# Patient Record
Sex: Female | Born: 1980
Health system: Southern US, Community
[De-identification: ages and names within clinical notes are randomized; demographics above are authoritative.]

## PROBLEM LIST (undated history)

## (undated) DIAGNOSIS — E669 Obesity, unspecified: Secondary | ICD-10-CM

## (undated) DIAGNOSIS — R5383 Other fatigue: Secondary | ICD-10-CM

## (undated) DIAGNOSIS — M2550 Pain in unspecified joint: Secondary | ICD-10-CM

## (undated) DIAGNOSIS — R002 Palpitations: Secondary | ICD-10-CM

## (undated) DIAGNOSIS — E0500 Thyrotoxicosis with diffuse goiter without thyrotoxic crisis or storm: Secondary | ICD-10-CM

## (undated) DIAGNOSIS — R7303 Prediabetes: Secondary | ICD-10-CM

## (undated) DIAGNOSIS — E119 Type 2 diabetes mellitus without complications: Secondary | ICD-10-CM

## (undated) DIAGNOSIS — F32A Depression, unspecified: Secondary | ICD-10-CM

## (undated) DIAGNOSIS — J188 Other pneumonia, unspecified organism: Secondary | ICD-10-CM

## (undated) DIAGNOSIS — F419 Anxiety disorder, unspecified: Secondary | ICD-10-CM

## (undated) DIAGNOSIS — Z8489 Family history of other specified conditions: Secondary | ICD-10-CM

## (undated) DIAGNOSIS — B977 Papillomavirus as the cause of diseases classified elsewhere: Secondary | ICD-10-CM

## (undated) DIAGNOSIS — U071 COVID-19: Secondary | ICD-10-CM

## (undated) DIAGNOSIS — R609 Edema, unspecified: Secondary | ICD-10-CM

## (undated) DIAGNOSIS — J189 Pneumonia, unspecified organism: Secondary | ICD-10-CM

## (undated) HISTORY — DX: Anxiety disorder, unspecified: F41.9

## (undated) HISTORY — DX: Prediabetes: R73.03

## (undated) HISTORY — PX: COLPOSCOPY: SHX161

## (undated) HISTORY — PX: TONSILLECTOMY: SUR1361

## (undated) HISTORY — DX: Obesity, unspecified: E66.9

## (undated) HISTORY — DX: Pneumonia, unspecified organism: J18.9

## (undated) HISTORY — DX: Depression, unspecified: F32.A

## (undated) HISTORY — DX: Other pneumonia, unspecified organism: J18.8

## (undated) HISTORY — DX: Edema, unspecified: R60.9

## (undated) HISTORY — DX: Other fatigue: R53.83

## (undated) HISTORY — DX: Palpitations: R00.2

## (undated) HISTORY — PX: TUBAL LIGATION: SHX77

## (undated) HISTORY — DX: Pain in unspecified joint: M25.50

## (undated) HISTORY — DX: Type 2 diabetes mellitus without complications: E11.9

---

## 2000-06-15 ENCOUNTER — Encounter (INDEPENDENT_AMBULATORY_CARE_PROVIDER_SITE_OTHER): Payer: Self-pay | Admitting: Specialist

## 2000-06-15 ENCOUNTER — Other Ambulatory Visit: Admission: RE | Admit: 2000-06-15 | Discharge: 2000-06-15 | Payer: Self-pay | Admitting: Obstetrics and Gynecology

## 2000-08-06 ENCOUNTER — Encounter (INDEPENDENT_AMBULATORY_CARE_PROVIDER_SITE_OTHER): Payer: Self-pay

## 2000-08-06 ENCOUNTER — Ambulatory Visit (HOSPITAL_COMMUNITY): Admission: RE | Admit: 2000-08-06 | Discharge: 2000-08-06 | Payer: Self-pay | Admitting: Obstetrics and Gynecology

## 2000-11-02 ENCOUNTER — Other Ambulatory Visit: Admission: RE | Admit: 2000-11-02 | Discharge: 2000-11-02 | Payer: Self-pay | Admitting: Obstetrics and Gynecology

## 2000-11-02 ENCOUNTER — Encounter (INDEPENDENT_AMBULATORY_CARE_PROVIDER_SITE_OTHER): Payer: Self-pay | Admitting: Specialist

## 2001-10-10 ENCOUNTER — Other Ambulatory Visit: Admission: RE | Admit: 2001-10-10 | Discharge: 2001-10-10 | Payer: Self-pay | Admitting: Obstetrics and Gynecology

## 2003-11-02 ENCOUNTER — Other Ambulatory Visit: Admission: RE | Admit: 2003-11-02 | Discharge: 2003-11-02 | Payer: Self-pay | Admitting: Obstetrics and Gynecology

## 2004-04-28 ENCOUNTER — Other Ambulatory Visit: Admission: RE | Admit: 2004-04-28 | Discharge: 2004-04-28 | Payer: Self-pay | Admitting: Obstetrics and Gynecology

## 2005-01-01 ENCOUNTER — Other Ambulatory Visit: Admission: RE | Admit: 2005-01-01 | Discharge: 2005-01-01 | Payer: Self-pay | Admitting: Obstetrics and Gynecology

## 2005-07-16 ENCOUNTER — Other Ambulatory Visit: Admission: RE | Admit: 2005-07-16 | Discharge: 2005-07-16 | Payer: Self-pay | Admitting: Obstetrics and Gynecology

## 2006-09-10 ENCOUNTER — Other Ambulatory Visit: Admission: RE | Admit: 2006-09-10 | Discharge: 2006-09-10 | Payer: Self-pay | Admitting: Obstetrics and Gynecology

## 2008-03-16 ENCOUNTER — Other Ambulatory Visit: Admission: RE | Admit: 2008-03-16 | Discharge: 2008-03-16 | Payer: Self-pay | Admitting: Obstetrics and Gynecology

## 2009-01-05 ENCOUNTER — Inpatient Hospital Stay (HOSPITAL_COMMUNITY): Admission: AD | Admit: 2009-01-05 | Discharge: 2009-01-08 | Payer: Self-pay | Admitting: Obstetrics and Gynecology

## 2009-04-18 ENCOUNTER — Other Ambulatory Visit: Admission: RE | Admit: 2009-04-18 | Discharge: 2009-04-18 | Payer: Self-pay | Admitting: Obstetrics and Gynecology

## 2010-04-22 ENCOUNTER — Other Ambulatory Visit: Admission: RE | Admit: 2010-04-22 | Discharge: 2010-04-22 | Payer: Self-pay | Admitting: Internal Medicine

## 2010-11-03 LAB — CBC
HCT: 30.4 % — ABNORMAL LOW (ref 36.0–46.0)
HCT: 35.5 % — ABNORMAL LOW (ref 36.0–46.0)
Hemoglobin: 10.8 g/dL — ABNORMAL LOW (ref 12.0–15.0)
Hemoglobin: 12.6 g/dL (ref 12.0–15.0)
MCHC: 35.4 g/dL (ref 30.0–36.0)
MCHC: 35.6 g/dL (ref 30.0–36.0)
MCV: 88.2 fL (ref 78.0–100.0)
MCV: 89 fL (ref 78.0–100.0)
Platelets: 140 10*3/uL — ABNORMAL LOW (ref 150–400)
Platelets: 150 10*3/uL (ref 150–400)
RBC: 3.41 MIL/uL — ABNORMAL LOW (ref 3.87–5.11)
RBC: 4.02 MIL/uL (ref 3.87–5.11)
RDW: 15.9 % — ABNORMAL HIGH (ref 11.5–15.5)
RDW: 16.1 % — ABNORMAL HIGH (ref 11.5–15.5)
WBC: 10.5 10*3/uL (ref 4.0–10.5)
WBC: 21.2 10*3/uL — ABNORMAL HIGH (ref 4.0–10.5)

## 2010-11-03 LAB — COMPREHENSIVE METABOLIC PANEL
ALT: 14 U/L (ref 0–35)
AST: 21 U/L (ref 0–37)
Albumin: 2.8 g/dL — ABNORMAL LOW (ref 3.5–5.2)
Alkaline Phosphatase: 140 U/L — ABNORMAL HIGH (ref 39–117)
BUN: 9 mg/dL (ref 6–23)
CO2: 22 mEq/L (ref 19–32)
Calcium: 9.2 mg/dL (ref 8.4–10.5)
Chloride: 107 mEq/L (ref 96–112)
Creatinine, Ser: 0.73 mg/dL (ref 0.4–1.2)
GFR calc Af Amer: >60 mL/min (ref >60)
GFR calc non Af Amer: >60 mL/min (ref >60)
Glucose, Bld: 90 mg/dL (ref 70–99)
Potassium: 3.9 mEq/L (ref 3.5–5.1)
Sodium: 137 mEq/L (ref 135–145)
Total Bilirubin: 0.6 mg/dL (ref 0.3–1.2)
Total Protein: 6.3 g/dL (ref 6.0–8.3)

## 2010-11-03 LAB — URINALYSIS, DIPSTICK ONLY
Bilirubin Urine: NEGATIVE
Glucose, UA: NEGATIVE mg/dL
Hgb urine dipstick: NEGATIVE
Ketones, ur: NEGATIVE mg/dL
Nitrite: NEGATIVE
Protein, ur: NEGATIVE mg/dL
Specific Gravity, Urine: 1.025 (ref 1.005–1.030)
Urobilinogen, UA: 0.2 mg/dL (ref 0.0–1.0)
pH: 6 (ref 5.0–8.0)

## 2010-11-03 LAB — URIC ACID: Uric Acid, Serum: 6.6 mg/dL (ref 2.4–7.0)

## 2010-11-03 LAB — RPR: RPR Ser Ql: NONREACTIVE

## 2010-11-03 LAB — LACTATE DEHYDROGENASE: LDH: 150 U/L (ref 94–250)

## 2010-12-12 NOTE — H&P (Signed)
[facility]  Patient:    Brittney Lester, Brittney Lester                          MRN: [id number] Adm. Date:  08/06/00 Attending:  Beather Arbour. Thomasena Edis, M.D.                         History and Physical  HISTORY OF PRESENT ILLNESS:   The patient is a 30 year old para 0 who was found to have a low grade squamous intraepithelial lesion on her Pap smear. 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  The patient is thus admitted for laser treatment to this area.  Risks of surgery including anesthetic complications, hemorrhage, infection, damage to adjacent structures including bladder, bowel, blood vessels, or ureters were discussed with the patient.  She was made aware of the risk of third degree burn which could result in need for a skin graft, however, this usually does not occur. In addition she was made aware of retinal damage.  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  The patient expressed understanding of and acceptance of these risks.  MEDICATIONS:                  Ortho-Tricyclen.  ALLERGIES:                    No known drug allergies.  PAST MEDICAL HISTORY:         None except for suppressed TSH in the process of being worked up.  FAMILY HISTORY:               Positive for diabetes, coronary artery disease, MI, and hypertension.  REVIEW OF SYSTEMS:            Negative.  PHYSICAL EXAMINATION:  HEENT:                        Normal.  NECK:                         Supple without thyromegaly.  LUNGS:                        Clear to auscultation.  HEART:                        Regular rate and rhythm.  ABDOMEN:                      Soft and nontender with no hepatosplenomegaly.  PELVIC:                       BUS normal.  Vagina normal.  The VAIN-1 and 2 can only be seen with the colposcope.   The uterus is normal, mobile.  No adnexal masses palpated.  RECTAL:                       Confirmatory with no mass.  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  The patient does understand that additional biopsies may need to be taken and that she may also have laser treatment to this area as well. DD:  08/06/00 TD:  08/06/00 Job: [id number]

## 2010-12-12 NOTE — Op Note (Signed)
[facility]  Patient:    GENEA, RHEAUME                       MRN: [id number] Proc. Date: 08/06/00 Adm. 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  POSTOPERATIVE DIAGNOSES:      Acetowhite epithelium at left hymenal ring, multipigmented right mons pubis nevus.  PROCEDURE:                    Laser to VAIN 2, biopsy of left hymenal ring, and laser to abnormal areas of left hymenal ring, excision of right mons pubis multipigmented nevus.  SURGEON:                      Beather Arbour. Thomasena Edis, M.D., Ph.D.  Bronwen Betters BLOOD LOSS:         Minimal.  ANESTHESIA:                   Monitored anesthesia care plus 15 cc 1% lidocaine local anesthesia.  COMPLICATIONS:                None.  FLUIDS:                       1400 cc of crystalloid.  DRAINS:                       None.  FINDINGS:                     Acetowhite epithelium consistent with VAIN 2 at right hymenal ring.  Left hymenal ring with new onset acetowhite epithelium and some mosaicism which was biopsied in two places and right mons pubis multipigmented vulvar lesion.  DESCRIPTION OF OPERATION:     The patient was brought to the operating room and identified on the operating room table.  After she was adequately sedated, she was placed in the dorsal lithotomy position and draped with wet towels. An ice pack was placed across the vulvar area.  Inspection did reveal a multipigmented right vulvar nevus, and the decision was made to remove this as well.  A very careful and thorough culdoscopic examination was performed all in the vagina.  There were noted to be no additional abnormal areas deep in the vagina; however, there was noted to be an area of mosaicism with acetowhite epithelium at the left hymenal ring, and this was biopsied in two places.  Using  the laser on the most focused beam at 10 watts, after infiltrating the abnormal areas with 1% lidocaine, the areas were then lasered without difficulty down to pink adventicial type tissue.  Excellent treatment was noted.  There was noted to be minimal bleeding.  This was done very carefully to ensure the area was entirely adequately treated.  Then Silvadene cream was applied to the areas that had been lasered.  Attention was turned to the right multipigmented vulvar nevus.  This area was infiltrated with 1% lidocaine, and an elliptical incision was performed with the entire nevus excised and sent to pathology for examination.  The skin edges were then reapproximated using four interrupted sutures of 4-0 Ethilon.  At that point, the procedure was then terminated.  The patient tolerated the procedure well without apparent complications and was transferred to the recovery room in stable condition after sponge, needle and instrument counts were correct.  The patient was given postoperative instructions, urged to place 1/2 to 1 cup of Instant Ocean in the bathtub and soak 2-3 times daily and dry off with a hair dryer and use Silvadene cream afterward.  She was also to apply triple antibiotic ointment to the vulvar nevus site and will return in ten days to have the sutures removed.  She was given a prescription for Darvocet-N 100, 1-2 p.o. q.4-6h. p.r.n. pain and a prescription for Silvadene cream, 1 tube with 1 refill.  She is urged to call with any problems. DD:  08/06/00 TD:  08/07/00 Job: [id number]

## 2010-12-31 ENCOUNTER — Inpatient Hospital Stay (HOSPITAL_COMMUNITY)
Admission: RE | Admit: 2010-12-31 | Discharge: 2011-01-02 | DRG: 373 | Disposition: A | Discharge status: Home / Self Care | Payer: BC Managed Care – PPO | Source: Ambulatory Visit | Attending: Obstetrics and Gynecology | Admitting: Obstetrics and Gynecology

## 2010-12-31 DIAGNOSIS — D649 Anemia, unspecified: Secondary | ICD-10-CM | POA: Diagnosis not present

## 2010-12-31 DIAGNOSIS — O9903 Anemia complicating the puerperium: Secondary | ICD-10-CM | POA: Diagnosis not present

## 2010-12-31 LAB — CBC
HCT: 36.2 % (ref 36.0–46.0)
Hemoglobin: 12.5 g/dL (ref 12.0–15.0)
MCH: 30 pg (ref 26.0–34.0)
MCHC: 34.5 g/dL (ref 30.0–36.0)
MCV: 87 fL (ref 78.0–100.0)
Platelets: 158 10*3/uL (ref 150–400)
RBC: 4.16 MIL/uL (ref 3.87–5.11)
RDW: 15.9 % — ABNORMAL HIGH (ref 11.5–15.5)
WBC: 12.7 10*3/uL — ABNORMAL HIGH (ref 4.0–10.5)

## 2010-12-31 LAB — ABO/RH: ABO/RH(D): O POS

## 2010-12-31 LAB — RPR: RPR Ser Ql: NONREACTIVE

## 2011-01-01 LAB — CBC
HCT: 32.4 % — ABNORMAL LOW (ref 36.0–46.0)
Hemoglobin: 10.9 g/dL — ABNORMAL LOW (ref 12.0–15.0)
MCH: 29.6 pg (ref 26.0–34.0)
MCHC: 33.6 g/dL (ref 30.0–36.0)
MCV: 88 fL (ref 78.0–100.0)
Platelets: 139 10*3/uL — ABNORMAL LOW (ref 150–400)
RBC: 3.68 MIL/uL — ABNORMAL LOW (ref 3.87–5.11)
RDW: 16 % — ABNORMAL HIGH (ref 11.5–15.5)
WBC: 13.2 10*3/uL — ABNORMAL HIGH (ref 4.0–10.5)

## 2012-07-27 NOTE — L&D Delivery Note (Signed)
Delivery Note At 12:51 AM a viable female was delivered via Vaginal, Spontaneous Delivery (Presentation: Left Occiput Anterior)by RN Jae Dire at 1251 I arrived at 12:53 cord was clamped x 2 and cut. Placenta was delivered intact. .  APGAR: 8,9 ; weight .   Placenta status: Intact, Spontaneous.  Cord: 3 vessels with the following complications: .  Cord pH: NA  Anesthesia: Local  Episiotomy: None Lacerations: 2nd degree;Perineal Suture Repair: 3.0 vicryl Est. Blood Loss (mL): 250  Mom to postpartum.  Baby to nursery-stable.  Brittney Lester J. 04/12/2013, 1:34 AM

## 2012-09-05 LAB — OB RESULTS CONSOLE RUBELLA ANTIBODY, IGM: Rubella: IMMUNE

## 2012-09-05 LAB — OB RESULTS CONSOLE ANTIBODY SCREEN: Antibody Screen: NEGATIVE

## 2012-09-05 LAB — OB RESULTS CONSOLE ABO/RH: RH Type: POSITIVE

## 2012-09-05 LAB — OB RESULTS CONSOLE HIV ANTIBODY (ROUTINE TESTING): HIV: NONREACTIVE

## 2012-09-05 LAB — OB RESULTS CONSOLE HEPATITIS B SURFACE ANTIGEN: Hepatitis B Surface Ag: NEGATIVE

## 2012-09-05 LAB — OB RESULTS CONSOLE RPR: RPR: NONREACTIVE

## 2012-10-07 ENCOUNTER — Other Ambulatory Visit (HOSPITAL_COMMUNITY)
Admission: RE | Admit: 2012-10-07 | Discharge: 2012-10-07 | Disposition: A | Discharge status: Home / Self Care | Payer: BC Managed Care – PPO | Source: Ambulatory Visit | Attending: Obstetrics and Gynecology | Admitting: Obstetrics and Gynecology

## 2012-10-07 ENCOUNTER — Other Ambulatory Visit: Payer: Self-pay | Admitting: Obstetrics and Gynecology

## 2012-10-07 DIAGNOSIS — Z113 Encounter for screening for infections with a predominantly sexual mode of transmission: Secondary | ICD-10-CM | POA: Insufficient documentation

## 2012-10-07 DIAGNOSIS — Z01419 Encounter for gynecological examination (general) (routine) without abnormal findings: Secondary | ICD-10-CM | POA: Insufficient documentation

## 2012-10-07 DIAGNOSIS — Z1151 Encounter for screening for human papillomavirus (HPV): Secondary | ICD-10-CM | POA: Insufficient documentation

## 2013-02-08 ENCOUNTER — Encounter: Payer: Self-pay | Admitting: *Deleted

## 2013-02-08 ENCOUNTER — Encounter: Payer: BC Managed Care – PPO | Attending: Obstetrics and Gynecology | Admitting: *Deleted

## 2013-02-08 VITALS — Ht 64.0 in | Wt 298.9 lb

## 2013-02-08 DIAGNOSIS — Z713 Dietary counseling and surveillance: Secondary | ICD-10-CM | POA: Insufficient documentation

## 2013-02-08 DIAGNOSIS — O09293 Supervision of pregnancy with other poor reproductive or obstetric history, third trimester: Secondary | ICD-10-CM

## 2013-02-08 DIAGNOSIS — O99810 Abnormal glucose complicating pregnancy: Secondary | ICD-10-CM | POA: Insufficient documentation

## 2013-02-08 NOTE — Patient Instructions (Addendum)
GOALS:  Check glucose levels per MD as instructed  Follow Gestational Diabetes Diet as instructed  Call for follow-up as needed

## 2013-02-08 NOTE — Progress Notes (Signed)
Patient was seen on 02-08-13 for Gestational Diabetes self-management class at the [facility]. The following learning objectives were met by the patient during this course:             States the definition of Gestational Diabetes          States why dietary management is important in controlling blood glucose           Describes the effects each nutrient has on blood glucose levels          Demonstrates ability to create a balanced meal plan           Demonstrates carbohydrate counting            States when to check blood glucose levels           Demonstrates proper blood glucose monitoring techniques          States the effect of stress and exercise on blood glucose levels           States the importance of limiting caffeine and abstaining from alcohol and smoking   Blood glucose monitor given: [employer] Next Lot # [id number] Exp: 10/2013 Blood glucose reading: 93   Patient instructed to monitor glucose levels: FBS: 60 - <90 1 hour: <140 2 hour: <120   *Patient received handouts:          Nutrition Diabetes and Pregnancy           Carbohydrate Counting List   Patient will be seen for follow-up as needed

## 2013-02-09 ENCOUNTER — Encounter: Payer: Self-pay | Admitting: *Deleted

## 2013-02-09 NOTE — Progress Notes (Signed)
  Patient was seen on 02/08/13 for Gestational Diabetes self-management class at the [facility]. The following learning objectives were met by the patient during this course:   States the definition of Gestational Diabetes  States why dietary management is important in controlling blood glucose  Describes the effects each nutrient has on blood glucose levels  Demonstrates ability to create a balanced meal plan  Demonstrates carbohydrate counting   States when to check blood glucose levels  Demonstrates proper blood glucose monitoring techniques  States the effect of stress and exercise on blood glucose levels  States the importance of limiting caffeine and abstaining from alcohol and smoking  Blood glucose monitor given: [employer] Next Lot # [id number] Exp: 10/2013 Blood glucose reading: 93 mg/dl  Patient instructed to monitor glucose levels: FBS: 60 - <90 1 hour: <140 2 hour: <120  *Patient received handouts:  Nutrition Diabetes and Pregnancy  Carbohydrate Counting List  Patient will be seen for follow-up as needed.

## 2013-03-20 LAB — OB RESULTS CONSOLE GBS: GBS: NEGATIVE

## 2013-04-11 ENCOUNTER — Telehealth (HOSPITAL_COMMUNITY): Payer: Self-pay | Admitting: *Deleted

## 2013-04-11 ENCOUNTER — Inpatient Hospital Stay (HOSPITAL_COMMUNITY)
Admission: AD | Admit: 2013-04-11 | Discharge: 2013-04-14 | DRG: 372 | Disposition: A | Discharge status: Home / Self Care | Payer: BC Managed Care – PPO | Source: Ambulatory Visit | Attending: Obstetrics and Gynecology | Admitting: Obstetrics and Gynecology

## 2013-04-11 ENCOUNTER — Encounter (HOSPITAL_COMMUNITY): Payer: Self-pay | Admitting: *Deleted

## 2013-04-11 DIAGNOSIS — O24419 Gestational diabetes mellitus in pregnancy, unspecified control: Secondary | ICD-10-CM | POA: Diagnosis present

## 2013-04-11 DIAGNOSIS — O2432 Unspecified pre-existing diabetes mellitus in childbirth: Secondary | ICD-10-CM | POA: Diagnosis present

## 2013-04-11 DIAGNOSIS — E119 Type 2 diabetes mellitus without complications: Secondary | ICD-10-CM | POA: Diagnosis present

## 2013-04-11 NOTE — Telephone Encounter (Signed)
Preadmission screen  

## 2013-04-12 ENCOUNTER — Encounter (HOSPITAL_COMMUNITY): Payer: Self-pay | Admitting: *Deleted

## 2013-04-12 LAB — CBC
HCT: 31 % — ABNORMAL LOW (ref 36.0–46.0)
HCT: 34.6 % — ABNORMAL LOW (ref 36.0–46.0)
Hemoglobin: 10.8 g/dL — ABNORMAL LOW (ref 12.0–15.0)
Hemoglobin: 12.2 g/dL (ref 12.0–15.0)
MCH: 29 pg (ref 26.0–34.0)
MCH: 29.1 pg (ref 26.0–34.0)
MCHC: 34.8 g/dL (ref 30.0–36.0)
MCHC: 35.3 g/dL (ref 30.0–36.0)
MCV: 82.6 fL (ref 78.0–100.0)
MCV: 83.1 fL (ref 78.0–100.0)
Platelets: 171 10*3/uL (ref 150–400)
Platelets: 177 10*3/uL (ref 150–400)
RBC: 3.73 MIL/uL — ABNORMAL LOW (ref 3.87–5.11)
RBC: 4.19 MIL/uL (ref 3.87–5.11)
RDW: 16.5 % — ABNORMAL HIGH (ref 11.5–15.5)
RDW: 16.8 % — ABNORMAL HIGH (ref 11.5–15.5)
WBC: 14 10*3/uL — ABNORMAL HIGH (ref 4.0–10.5)
WBC: 18.7 10*3/uL — ABNORMAL HIGH (ref 4.0–10.5)

## 2013-04-12 LAB — TYPE AND SCREEN
ABO/RH(D): O POS
Antibody Screen: NEGATIVE

## 2013-04-12 LAB — RPR: RPR Ser Ql: NONREACTIVE

## 2013-04-12 MED ORDER — METHYLERGONOVINE MALEATE 0.2 MG PO TABS: 0.2000 mg | ORAL_TABLET | ORAL | Status: DC | PRN

## 2013-04-12 MED ORDER — OXYCODONE-ACETAMINOPHEN 5-325 MG PO TABS: 1.0000 | ORAL_TABLET | ORAL | Status: DC | PRN

## 2013-04-12 MED ORDER — FERROUS SULFATE 325 (65 FE) MG PO TABS
325.0000 mg | ORAL_TABLET | Freq: Two times a day (BID) | ORAL | Status: DC
  Administered 2013-04-12 – 2013-04-14 (×5): 325 mg via ORAL
  Filled 2013-04-12 (×6): qty 1

## 2013-04-12 MED ORDER — ONDANSETRON HCL 4 MG/2ML IJ SOLN: 4.0000 mg | Freq: Four times a day (QID) | INTRAMUSCULAR | Status: DC | PRN

## 2013-04-12 MED ORDER — ACETAMINOPHEN 325 MG PO TABS: 650.0000 mg | ORAL_TABLET | ORAL | Status: DC | PRN

## 2013-04-12 MED ORDER — FLEET ENEMA 7-19 GM/118ML RE ENEM: 1.0000 | ENEMA | RECTAL | Status: DC | PRN

## 2013-04-12 MED ORDER — METHYLERGONOVINE MALEATE 0.2 MG/ML IJ SOLN: 0.2000 mg | INTRAMUSCULAR | Status: DC | PRN

## 2013-04-12 MED ORDER — EPHEDRINE 5 MG/ML INJ: 10.0000 mg | INTRAVENOUS | Status: DC | PRN

## 2013-04-12 MED ORDER — LACTATED RINGERS IV SOLN: 500.0000 mL | INTRAVENOUS | Status: DC | PRN

## 2013-04-12 MED ORDER — PHENYLEPHRINE 40 MCG/ML (10ML) SYRINGE FOR IV PUSH (FOR BLOOD PRESSURE SUPPORT)
80.0000 ug | PREFILLED_SYRINGE | INTRAVENOUS | Status: DC | PRN
  Filled 2013-04-12: qty 5

## 2013-04-12 MED ORDER — SIMETHICONE 80 MG PO CHEW: 80.0000 mg | CHEWABLE_TABLET | ORAL | Status: DC | PRN

## 2013-04-12 MED ORDER — TETANUS-DIPHTH-ACELL PERTUSSIS 5-2.5-18.5 LF-MCG/0.5 IM SUSP: 0.5000 mL | Freq: Once | INTRAMUSCULAR | Status: DC

## 2013-04-12 MED ORDER — ONDANSETRON HCL 4 MG PO TABS: 4.0000 mg | ORAL_TABLET | ORAL | Status: DC | PRN

## 2013-04-12 MED ORDER — IBUPROFEN 600 MG PO TABS
600.0000 mg | ORAL_TABLET | Freq: Four times a day (QID) | ORAL | Status: DC
  Administered 2013-04-12 – 2013-04-14 (×9): 600 mg via ORAL
  Filled 2013-04-12 (×9): qty 1

## 2013-04-12 MED ORDER — DIBUCAINE 1 % RE OINT
1.0000 "application " | TOPICAL_OINTMENT | RECTAL | Status: DC | PRN
  Filled 2013-04-12: qty 28

## 2013-04-12 MED ORDER — PRENATAL MULTIVITAMIN CH
1.0000 | ORAL_TABLET | Freq: Every day | ORAL | Status: DC
  Administered 2013-04-12 – 2013-04-13 (×2): 1 via ORAL
  Filled 2013-04-12 (×2): qty 1

## 2013-04-12 MED ORDER — FENTANYL 2.5 MCG/ML BUPIVACAINE 1/10 % EPIDURAL INFUSION (WH - ANES)
14.0000 mL/h | INTRAMUSCULAR | Status: DC | PRN
  Filled 2013-04-12: qty 125

## 2013-04-12 MED ORDER — SENNOSIDES-DOCUSATE SODIUM 8.6-50 MG PO TABS: 2.0000 | ORAL_TABLET | ORAL | Status: DC

## 2013-04-12 MED ORDER — OXYTOCIN BOLUS FROM INFUSION
500.0000 mL | INTRAVENOUS | Status: DC
  Administered 2013-04-12: 500 mL via INTRAVENOUS

## 2013-04-12 MED ORDER — INFLUENZA VAC SPLIT QUAD 0.5 ML IM SUSP
0.5000 mL | INTRAMUSCULAR | Status: AC
  Administered 2013-04-12: 0.5 mL via INTRAMUSCULAR

## 2013-04-12 MED ORDER — OXYTOCIN 40 UNITS IN LACTATED RINGERS INFUSION - SIMPLE MED
62.5000 mL/h | INTRAVENOUS | Status: DC
  Filled 2013-04-12: qty 1000

## 2013-04-12 MED ORDER — ONDANSETRON HCL 4 MG/2ML IJ SOLN: 4.0000 mg | INTRAMUSCULAR | Status: DC | PRN

## 2013-04-12 MED ORDER — IBUPROFEN 600 MG PO TABS: 600.0000 mg | ORAL_TABLET | Freq: Four times a day (QID) | ORAL | Status: DC | PRN

## 2013-04-12 MED ORDER — LANOLIN HYDROUS EX OINT: TOPICAL_OINTMENT | CUTANEOUS | Status: DC | PRN

## 2013-04-12 MED ORDER — DIPHENHYDRAMINE HCL 50 MG/ML IJ SOLN: 12.5000 mg | INTRAMUSCULAR | Status: DC | PRN

## 2013-04-12 MED ORDER — LACTATED RINGERS IV SOLN: 500.0000 mL | Freq: Once | INTRAVENOUS | Status: DC

## 2013-04-12 MED ORDER — LACTATED RINGERS IV SOLN: INTRAVENOUS | Status: DC

## 2013-04-12 MED ORDER — CITRIC ACID-SODIUM CITRATE 334-500 MG/5ML PO SOLN: 30.0000 mL | ORAL | Status: DC | PRN

## 2013-04-12 MED ORDER — DIPHENHYDRAMINE HCL 25 MG PO CAPS: 25.0000 mg | ORAL_CAPSULE | Freq: Four times a day (QID) | ORAL | Status: DC | PRN

## 2013-04-12 MED ORDER — LIDOCAINE HCL (PF) 1 % IJ SOLN
30.0000 mL | INTRAMUSCULAR | Status: DC | PRN
  Administered 2013-04-12: 30 mL via SUBCUTANEOUS
  Filled 2013-04-12: qty 30

## 2013-04-12 MED ORDER — BENZOCAINE-MENTHOL 20-0.5 % EX AERO
1.0000 "application " | INHALATION_SPRAY | CUTANEOUS | Status: DC | PRN
  Administered 2013-04-13: 1 via TOPICAL
  Filled 2013-04-12 (×2): qty 56

## 2013-04-12 MED ORDER — WITCH HAZEL-GLYCERIN EX PADS: 1.0000 "application " | MEDICATED_PAD | CUTANEOUS | Status: DC | PRN

## 2013-04-12 MED ORDER — EPHEDRINE 5 MG/ML INJ
10.0000 mg | INTRAVENOUS | Status: DC | PRN
  Filled 2013-04-12: qty 4

## 2013-04-12 MED ORDER — PHENYLEPHRINE 40 MCG/ML (10ML) SYRINGE FOR IV PUSH (FOR BLOOD PRESSURE SUPPORT): 80.0000 ug | PREFILLED_SYRINGE | INTRAVENOUS | Status: DC | PRN

## 2013-04-12 MED ORDER — ZOLPIDEM TARTRATE 5 MG PO TABS: 5.0000 mg | ORAL_TABLET | Freq: Every evening | ORAL | Status: DC | PRN

## 2013-04-12 NOTE — Lactation Note (Signed)
This note was copied from the chart of Boy Johannah Rozas. Lactation Consultation Note  Patient Name: Boy Glinda Natzke [id number] Date: 04/12/2013 Reason for consult: Initial assessment Mom reports baby is latching well so far. Mom is experienced BF. Encouraged to BF with feeding ques, STS when Mom is awake. Basics reviewed. Lactation brochure left for review. Advised of OP services and support group. Advised to call if would like LC assist   Maternal Data Formula Feeding for Exclusion: No Infant to breast within first hour of birth: Yes Has patient been taught Hand Expression?: No (Mom reports she know how to breastfeed) Does the patient have breastfeeding experience prior to this delivery?: Yes  Feeding Feeding Type: Breast Milk Length of feed: 15 min  LATCH Score/Interventions                      Lactation Tools Discussed/Used     Consult Status Consult Status: Follow-up Date: 04/13/13 Follow-up type: In-patient    Alfred Levins 04/12/2013, 3:31 PM

## 2013-04-12 NOTE — Progress Notes (Signed)
Faculty practice requested for standby, Richardson Dopp called for update, and [place] phoned for standby due to MD enroute.  S Elna Radovich RN

## 2013-04-12 NOTE — MAU Note (Signed)
contractions 

## 2013-04-12 NOTE — MAU Note (Signed)
To 169 for eval of labor

## 2013-04-12 NOTE — H&P (Signed)
Brittney Lester is a 32 y.o. female presenting at [redacted] wks ega with EDD 04/19/2013. 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 Pt presented to mau  With regular contractions and was  7 cm dilated and was taken to birthing suites. She had SROM at 1245 and delivered precipitously with the RN Brittney Lester present and conducted delivery at 1251. I arrived at 1253 and infant was on mom's chest. Cord was still intact. I completed the delivery.   Maternal Medical History:  Reason for admission: Contractions.   Contractions: Onset was 3-5 hours ago.   Frequency: regular.   Perceived severity is strong.    Fetal activity: Perceived fetal activity is normal.      OB History   Grav Para Term Preterm Abortions TAB SAB Ect Mult Living   3 2 2       2      Past Medical History  Diagnosis Date  . Diabetes mellitus without complication    Past Surgical History  Procedure Laterality Date  . Colposcopy    . Tonsillectomy     Family History: family history includes Heart disease in her maternal grandfather; Hypertension in her mother and other; Thyroid disease in her mother. Social History:  reports that she has never smoked. She does not have any smokeless tobacco history on file. Her alcohol and drug histories are not on file.   Prenatal Transfer Tool  Maternal Diabetes: Yes:  Diabetes Type:  Insulin/Medication controlled Genetic Screening: Normal Maternal Ultrasounds/Referrals: Normal Fetal Ultrasounds or other Referrals:  None Maternal Substance Abuse:  No Significant Maternal Medications:  Meds include: Other: glyburide  Significant Maternal Lab Results:  Lab values include: Group B Strep negative Other Comments:  None  Review of Systems  All other systems reviewed and are negative.    Dilation: 7 Effacement (%): 90 Station: -1 Exam by:: Brittney Lester Blood pressure 133/71, pulse 102, resp. rate 18, height 5\' 4"  (1.626 m), weight 137.893 kg (304 lb), last menstrual period 07/13/2012.   Fetal Exam Fetal  State Assessment: Category I - tracings are normal.     Physical Exam  Constitutional: She is oriented to person, place, and time. She appears well-developed and well-nourished.  Cardiovascular: Normal rate and regular rhythm.   Respiratory: Effort normal and breath sounds normal.  GI: Soft. There is no tenderness. There is no rebound and no guarding.  Neurological: She is alert and oriented to person, place, and time.  Skin: Skin is warm and dry.  Psychiatric: She has a normal mood and affect.    Prenatal labs: ABO, Rh: O/Positive/-- (02/10 0000) Antibody: Negative (02/10 0000) Rubella: Immune (02/10 0000) RPR: Nonreactive (02/10 0000)  HBsAg: Negative (02/10 0000)  HIV: Non-reactive (02/10 0000)  GBS: Negative (08/25 0000)   Assessment/Plan: 39 wks with active labor and precipitous delivery  gbs negative  2nd degree laceration repaired with 3-0 vicryl  Transfer to Mother Baby for routine postpartum care.  Pt desires circumcision of infant r/b/a of circumcision discussed.    Brittney Lester J. 04/12/2013, 1:25 AM

## 2013-04-13 NOTE — Progress Notes (Signed)
Post Partum Day 1 Subjective: no complaints  Objective: Blood pressure 104/70, pulse 94, temperature 98.3 F (36.8 C), temperature source Oral, resp. rate 18, height 5\' 4"  (1.626 m), weight 137.893 kg (304 lb), last menstrual period 07/13/2012, SpO2 98.00%, unknown if currently breastfeeding.  Physical Exam:  General: alert, cooperative and no distress Lochia: appropriate Uterine Fundus: firm Incision: n/a DVT Evaluation: No evidence of DVT seen on physical exam. Calf/Ankle edema is present.   Recent Labs  04/12/13 0025 04/12/13 0545  HGB 12.2 10.8*  HCT 34.6* 31.0*    Assessment/Plan: S/p SVD, GDM-doing well. Plan for discharge tomorrow Circumcision in am to be done by Dr. Richardson Dopp. Continue routine postpartum care.   LOS: 2 days   Brittney Lester 04/13/2013, 1:49 PM

## 2013-04-14 ENCOUNTER — Inpatient Hospital Stay (HOSPITAL_COMMUNITY): Admission: RE | Admit: 2013-04-14 | Payer: BC Managed Care – PPO | Source: Ambulatory Visit

## 2013-04-14 DIAGNOSIS — O24419 Gestational diabetes mellitus in pregnancy, unspecified control: Secondary | ICD-10-CM | POA: Diagnosis present

## 2013-04-14 MED ORDER — IBUPROFEN 600 MG PO TABS: 600.0000 mg | ORAL_TABLET | Freq: Four times a day (QID) | ORAL | Status: DC | PRN

## 2013-04-14 NOTE — Discharge Summary (Signed)
Obstetric Discharge Summary Reason for Admission: onset of labor Prenatal Procedures: none Intrapartum Procedures: spontaneous vaginal delivery Postpartum Procedures: none Complications-Operative and Postpartum: 2nd  degree perineal laceration Hemoglobin  Date Value Range Status  04/12/2013 10.8* 12.0 - 15.0 g/dL Final     HCT  Date Value Range Status  04/12/2013 31.0* 36.0 - 46.0 % Final    Physical Exam:  General: alert and cooperative Lochia: appropriate Uterine Fundus: firm Incision: NA DVT Evaluation: No evidence of DVT seen on physical exam.  Discharge Diagnoses: Term Pregnancy-delivered  Discharge Information: Date: 04/14/2013 Activity: pelvic rest Diet: routine Medications: PNV and Ibuprofen Condition: stable Instructions: refer to practice specific booklet Discharge to: home Follow-up Information   Follow up with Jessee Avers., MD. Schedule an appointment as soon as possible for a visit in 6 weeks. (post partum visit )    Specialty:  Obstetrics and Gynecology   Contact information:   [address] [place] [address] [phone]       Newborn Data: Live born female  Birth Weight: 8 lb 0.8 oz (3651 g) APGAR: 8, 9  Home with mother.  Torri Michalski J. 04/14/2013, 8:04 AM

## 2014-05-28 ENCOUNTER — Encounter (HOSPITAL_COMMUNITY): Payer: Self-pay | Admitting: *Deleted

## 2015-03-06 ENCOUNTER — Other Ambulatory Visit: Payer: Self-pay | Admitting: Obstetrics and Gynecology

## 2015-03-06 ENCOUNTER — Other Ambulatory Visit (HOSPITAL_COMMUNITY)
Admission: RE | Admit: 2015-03-06 | Discharge: 2015-03-06 | Disposition: A | Discharge status: Home / Self Care | Payer: BC Managed Care – PPO | Source: Ambulatory Visit | Attending: Obstetrics and Gynecology | Admitting: Obstetrics and Gynecology

## 2015-03-06 DIAGNOSIS — Z01419 Encounter for gynecological examination (general) (routine) without abnormal findings: Secondary | ICD-10-CM | POA: Insufficient documentation

## 2015-03-08 LAB — CYTOLOGY - PAP

## 2016-03-13 ENCOUNTER — Other Ambulatory Visit: Payer: Self-pay | Admitting: Obstetrics and Gynecology

## 2016-03-13 ENCOUNTER — Other Ambulatory Visit (HOSPITAL_COMMUNITY)
Admission: RE | Admit: 2016-03-13 | Discharge: 2016-03-13 | Disposition: A | Discharge status: Home / Self Care | Payer: BC Managed Care – PPO | Source: Ambulatory Visit | Attending: Obstetrics and Gynecology | Admitting: Obstetrics and Gynecology

## 2016-03-13 DIAGNOSIS — Z01419 Encounter for gynecological examination (general) (routine) without abnormal findings: Secondary | ICD-10-CM | POA: Diagnosis present

## 2016-03-13 DIAGNOSIS — Z1151 Encounter for screening for human papillomavirus (HPV): Secondary | ICD-10-CM | POA: Diagnosis not present

## 2016-03-17 LAB — CYTOLOGY - PAP

## 2016-07-13 NOTE — Patient Instructions (Signed)
Your procedure is scheduled on:  Wednesday, Dec. 27, 2017  [facility] at:  6:00 AM  Pick up the phone at the desk and dial [phone].  Call this number if you have problems the morning of surgery: [phone].  Remember: Do NOT eat food or drink after:  Midnight Tuesday  Take these medicines the morning of surgery with a SIP OF WATER:  None  Stop ALL herbal medications at this time   Do NOT wear jewelry (body piercing), metal hair clips/bobby pins, make-up, or nail polish. Do NOT wear lotions, powders, or perfumes.  You may wear deodorant. Do NOT shave for 48 hours prior to surgery. Do NOT bring valuables to the hospital. Contacts, dentures, or bridgework may not be worn into surgery.  Have a responsible adult drive you home and stay with you for 24 hours after your procedure

## 2016-07-14 ENCOUNTER — Other Ambulatory Visit: Payer: Self-pay | Admitting: Obstetrics and Gynecology

## 2016-07-14 ENCOUNTER — Encounter (HOSPITAL_COMMUNITY)
Admission: RE | Admit: 2016-07-14 | Discharge: 2016-07-14 | Disposition: A | Discharge status: Home / Self Care | Payer: BC Managed Care – PPO | Source: Ambulatory Visit | Attending: Obstetrics and Gynecology | Admitting: Obstetrics and Gynecology

## 2016-07-14 ENCOUNTER — Encounter (HOSPITAL_COMMUNITY): Payer: Self-pay

## 2016-07-14 DIAGNOSIS — Z01812 Encounter for preprocedural laboratory examination: Secondary | ICD-10-CM | POA: Diagnosis not present

## 2016-07-14 HISTORY — DX: Papillomavirus as the cause of diseases classified elsewhere: B97.7

## 2016-07-14 HISTORY — DX: Thyrotoxicosis with diffuse goiter without thyrotoxic crisis or storm: E05.00

## 2016-07-14 LAB — CBC
HCT: 41.3 % (ref 36.0–46.0)
Hemoglobin: 14.5 g/dL (ref 12.0–15.0)
MCH: 31.9 pg (ref 26.0–34.0)
MCHC: 35.1 g/dL (ref 30.0–36.0)
MCV: 90.8 fL (ref 78.0–100.0)
Platelets: 167 10*3/uL (ref 150–400)
RBC: 4.55 MIL/uL (ref 3.87–5.11)
RDW: 13.8 % (ref 11.5–15.5)
WBC: 8.3 10*3/uL (ref 4.0–10.5)

## 2016-07-21 NOTE — Anesthesia Preprocedure Evaluation (Addendum)
Anesthesia Evaluation  Patient identified by MRN, date of birth, ID band Patient awake    Reviewed: Allergy & Precautions, NPO status , Patient's Chart, lab work & pertinent test results  History of Anesthesia Complications Negative for: history of anesthetic complications  Airway Mallampati: II  TM Distance: >3 FB Neck ROM: Full    Dental  (+) Dental Advisory Given   Pulmonary neg pulmonary ROS,    breath sounds clear to auscultation       Cardiovascular negative cardio ROS   Rhythm:Regular Rate:Normal     Neuro/Psych negative neurological ROS     GI/Hepatic negative GI ROS,   Endo/Other  neg diabetes (gestational)Morbid obesityH/o gestational DM only  Renal/GU      Musculoskeletal   Abdominal (+) + obese,   Peds  Hematology negative hematology ROS (+)   Anesthesia Other Findings   Reproductive/Obstetrics                            Anesthesia Physical Anesthesia Plan  ASA: II  Anesthesia Plan: General   Post-op Pain Management:    Induction: Intravenous  Airway Management Planned: Oral ETT  Additional Equipment:   Intra-op Plan:   Post-operative Plan: Extubation in OR  Informed Consent: I have reviewed the patients History and Physical, chart, labs and discussed the procedure including the risks, benefits and alternatives for the proposed anesthesia with the patient or authorized representative who has indicated his/her understanding and acceptance.   Dental advisory given  Plan Discussed with: CRNA and Surgeon  Anesthesia Plan Comments: (Plan routine monitors, GETA)        Anesthesia Quick Evaluation

## 2016-07-22 ENCOUNTER — Ambulatory Visit (HOSPITAL_COMMUNITY)
Admission: RE | Admit: 2016-07-22 | Discharge: 2016-07-22 | Disposition: A | Discharge status: Home / Self Care | Payer: BC Managed Care – PPO | Source: Ambulatory Visit | Attending: Obstetrics and Gynecology | Admitting: Obstetrics and Gynecology

## 2016-07-22 ENCOUNTER — Ambulatory Visit (HOSPITAL_COMMUNITY): Payer: BC Managed Care – PPO | Admitting: Anesthesiology

## 2016-07-22 ENCOUNTER — Encounter (HOSPITAL_COMMUNITY): Payer: Self-pay | Admitting: Emergency Medicine

## 2016-07-22 ENCOUNTER — Encounter (HOSPITAL_COMMUNITY)
Admission: RE | Disposition: A | Discharge status: Home / Self Care | Payer: Self-pay | Source: Ambulatory Visit | Attending: Obstetrics and Gynecology

## 2016-07-22 ENCOUNTER — Other Ambulatory Visit: Payer: Self-pay | Admitting: Obstetrics and Gynecology

## 2016-07-22 DIAGNOSIS — N920 Excessive and frequent menstruation with regular cycle: Secondary | ICD-10-CM | POA: Diagnosis present

## 2016-07-22 DIAGNOSIS — Z302 Encounter for sterilization: Secondary | ICD-10-CM | POA: Insufficient documentation

## 2016-07-22 DIAGNOSIS — Z6841 Body Mass Index (BMI) 40.0 and over, adult: Secondary | ICD-10-CM | POA: Insufficient documentation

## 2016-07-22 HISTORY — PX: LAPAROSCOPIC TUBAL LIGATION: SHX1937

## 2016-07-22 HISTORY — PX: DILITATION & CURRETTAGE/HYSTROSCOPY WITH HYDROTHERMAL ABLATION: SHX5570

## 2016-07-22 HISTORY — DX: Family history of other specified conditions: Z84.89

## 2016-07-22 HISTORY — DX: Excessive and frequent menstruation with regular cycle: N92.0

## 2016-07-22 LAB — PREGNANCY, URINE: Preg Test, Ur: NEGATIVE

## 2016-07-22 SURGERY — LIGATION, FALLOPIAN TUBE, LAPAROSCOPIC
Anesthesia: General | Site: Vagina

## 2016-07-22 MED ORDER — MIDAZOLAM HCL 2 MG/2ML IJ SOLN
INTRAMUSCULAR | Status: AC
  Filled 2016-07-22: qty 2

## 2016-07-22 MED ORDER — SCOPOLAMINE 1 MG/3DAYS TD PT72
1.0000 | MEDICATED_PATCH | Freq: Once | TRANSDERMAL | Status: DC
  Administered 2016-07-22: 1.5 mg via TRANSDERMAL

## 2016-07-22 MED ORDER — SILVER NITRATE-POT NITRATE 75-25 % EX MISC
CUTANEOUS | Status: DC | PRN
  Administered 2016-07-22: 2

## 2016-07-22 MED ORDER — PROMETHAZINE HCL 25 MG/ML IJ SOLN: 6.2500 mg | INTRAMUSCULAR | Status: DC | PRN

## 2016-07-22 MED ORDER — ROCURONIUM BROMIDE 100 MG/10ML IV SOLN
INTRAVENOUS | Status: DC | PRN
  Administered 2016-07-22: 5 mg via INTRAVENOUS
  Administered 2016-07-22: 1 mg via INTRAVENOUS
  Administered 2016-07-22: 35 mg via INTRAVENOUS
  Administered 2016-07-22: 10 mg via INTRAVENOUS

## 2016-07-22 MED ORDER — DEXAMETHASONE SODIUM PHOSPHATE 10 MG/ML IJ SOLN
INTRAMUSCULAR | Status: AC
  Filled 2016-07-22: qty 1

## 2016-07-22 MED ORDER — KETOROLAC TROMETHAMINE 30 MG/ML IJ SOLN
INTRAMUSCULAR | Status: AC
  Filled 2016-07-22: qty 1

## 2016-07-22 MED ORDER — GLYCOPYRROLATE 0.2 MG/ML IJ SOLN
INTRAMUSCULAR | Status: AC
  Filled 2016-07-22: qty 1

## 2016-07-22 MED ORDER — BUPIVACAINE HCL (PF) 0.25 % IJ SOLN
INTRAMUSCULAR | Status: AC
  Filled 2016-07-22: qty 30

## 2016-07-22 MED ORDER — PROPOFOL 10 MG/ML IV BOLUS
INTRAVENOUS | Status: DC | PRN
  Administered 2016-07-22: 200 mg via INTRAVENOUS

## 2016-07-22 MED ORDER — MIDAZOLAM HCL 2 MG/2ML IJ SOLN: 0.5000 mg | Freq: Once | INTRAMUSCULAR | Status: DC | PRN

## 2016-07-22 MED ORDER — SUGAMMADEX SODIUM 200 MG/2ML IV SOLN
INTRAVENOUS | Status: DC | PRN
  Administered 2016-07-22: 200 mg via INTRAVENOUS

## 2016-07-22 MED ORDER — KETOROLAC TROMETHAMINE 30 MG/ML IJ SOLN
INTRAMUSCULAR | Status: DC | PRN
  Administered 2016-07-22: 30 mg via INTRAVENOUS

## 2016-07-22 MED ORDER — SODIUM CHLORIDE 0.9 % IR SOLN
Status: DC | PRN
  Administered 2016-07-22: 3000 mL

## 2016-07-22 MED ORDER — HYDROCODONE-ACETAMINOPHEN 5-325 MG PO TABS: 1.0000 | ORAL_TABLET | Freq: Four times a day (QID) | ORAL | 0 refills | Status: DC | PRN

## 2016-07-22 MED ORDER — LIDOCAINE 2% (20 MG/ML) 5 ML SYRINGE
INTRAMUSCULAR | Status: DC | PRN
  Administered 2016-07-22: 40 mg via INTRAVENOUS

## 2016-07-22 MED ORDER — FENTANYL CITRATE (PF) 250 MCG/5ML IJ SOLN
INTRAMUSCULAR | Status: AC
  Filled 2016-07-22: qty 5

## 2016-07-22 MED ORDER — ONDANSETRON HCL 4 MG/2ML IJ SOLN
INTRAMUSCULAR | Status: DC | PRN
Start: 2016-07-22 — End: 2016-07-22
  Administered 2016-07-22: 4 mg via INTRAVENOUS

## 2016-07-22 MED ORDER — MIDAZOLAM HCL 5 MG/5ML IJ SOLN
INTRAMUSCULAR | Status: DC | PRN
  Administered 2016-07-22: 2 mg via INTRAVENOUS

## 2016-07-22 MED ORDER — PROPOFOL 10 MG/ML IV BOLUS
INTRAVENOUS | Status: AC
  Filled 2016-07-22: qty 20

## 2016-07-22 MED ORDER — BUPIVACAINE HCL (PF) 0.25 % IJ SOLN
INTRAMUSCULAR | Status: DC | PRN
  Administered 2016-07-22 (×2): 10 mL
  Administered 2016-07-22: 20 mL

## 2016-07-22 MED ORDER — HYDROMORPHONE HCL 1 MG/ML IJ SOLN: 0.2500 mg | INTRAMUSCULAR | Status: DC | PRN

## 2016-07-22 MED ORDER — MEPERIDINE HCL 25 MG/ML IJ SOLN
6.2500 mg | INTRAMUSCULAR | Status: DC | PRN
Start: 2016-07-22 — End: 2016-07-22

## 2016-07-22 MED ORDER — FENTANYL CITRATE (PF) 100 MCG/2ML IJ SOLN
INTRAMUSCULAR | Status: DC | PRN
Start: 2016-07-22 — End: 2016-07-22
  Administered 2016-07-22: 100 ug via INTRAVENOUS
  Administered 2016-07-22 (×3): 50 ug via INTRAVENOUS

## 2016-07-22 MED ORDER — ONDANSETRON HCL 4 MG/2ML IJ SOLN
INTRAMUSCULAR | Status: AC
  Filled 2016-07-22: qty 2

## 2016-07-22 MED ORDER — LACTATED RINGERS IV SOLN
INTRAVENOUS | Status: DC
  Administered 2016-07-22 (×2): via INTRAVENOUS

## 2016-07-22 MED ORDER — LIDOCAINE HCL (CARDIAC) 20 MG/ML IV SOLN
INTRAVENOUS | Status: AC
  Filled 2016-07-22: qty 5

## 2016-07-22 MED ORDER — NEOSTIGMINE METHYLSULFATE 10 MG/10ML IV SOLN
INTRAVENOUS | Status: AC
  Filled 2016-07-22: qty 1

## 2016-07-22 MED ORDER — SCOPOLAMINE 1 MG/3DAYS TD PT72
MEDICATED_PATCH | TRANSDERMAL | Status: AC
  Administered 2016-07-22: 1.5 mg via TRANSDERMAL
  Filled 2016-07-22: qty 1

## 2016-07-22 MED ORDER — IBUPROFEN 800 MG PO TABS: 800.0000 mg | ORAL_TABLET | Freq: Three times a day (TID) | ORAL | 1 refills | Status: DC | PRN

## 2016-07-22 MED ORDER — DEXAMETHASONE SODIUM PHOSPHATE 4 MG/ML IJ SOLN
INTRAMUSCULAR | Status: DC | PRN
  Administered 2016-07-22: 10 mg via INTRAVENOUS

## 2016-07-22 SURGICAL SUPPLY — 35 items
ADH SKN CLS APL DERMABOND .7 (GAUZE/BANDAGES/DRESSINGS) ×2
CANISTER SUCT 3000ML (MISCELLANEOUS) ×3 IMPLANT
CATH ROBINSON RED A/P 16FR (CATHETERS) ×3 IMPLANT
CLOTH BEACON ORANGE TIMEOUT ST (SAFETY) ×3 IMPLANT
CONTAINER PREFILL 10% NBF 60ML (FORM) ×5 IMPLANT
DERMABOND ADVANCED (GAUZE/BANDAGES/DRESSINGS) ×1
DERMABOND ADVANCED .7 DNX12 (GAUZE/BANDAGES/DRESSINGS) ×2 IMPLANT
DILATOR CANAL MILEX (MISCELLANEOUS) IMPLANT
DRSG OPSITE POSTOP 3X4 (GAUZE/BANDAGES/DRESSINGS) ×1 IMPLANT
DURAPREP 26ML APPLICATOR (WOUND CARE) ×3 IMPLANT
ELECT REM PT RETURN 9FT ADLT (ELECTROSURGICAL) ×3
ELECTRODE REM PT RTRN 9FT ADLT (ELECTROSURGICAL) IMPLANT
GLOVE BIOGEL M 6.5 STRL (GLOVE) ×6 IMPLANT
GLOVE BIOGEL PI IND STRL 6.5 (GLOVE) ×2 IMPLANT
GLOVE BIOGEL PI IND STRL 7.0 (GLOVE) ×2 IMPLANT
GLOVE BIOGEL PI INDICATOR 6.5 (GLOVE) ×1
GLOVE BIOGEL PI INDICATOR 7.0 (GLOVE) ×1
GOWN STRL REUS W/TWL LRG LVL3 (GOWN DISPOSABLE) ×6 IMPLANT
PACK LAPAROSCOPY BASIN (CUSTOM PROCEDURE TRAY) ×3 IMPLANT
PACK TRENDGUARD 450 HYBRID PRO (MISCELLANEOUS) IMPLANT
PACK TRENDGUARD 600 HYBRD PROC (MISCELLANEOUS) IMPLANT
PACK VAGINAL MINOR WOMEN LF (CUSTOM PROCEDURE TRAY) ×2 IMPLANT
PAD OB MATERNITY 4.3X12.25 (PERSONAL CARE ITEMS) ×3 IMPLANT
PROTECTOR NERVE ULNAR (MISCELLANEOUS) ×5 IMPLANT
SET GENESYS HTA PROCERVA (MISCELLANEOUS) ×1 IMPLANT
SLEEVE XCEL OPT CAN 5 100 (ENDOMECHANICALS) ×2 IMPLANT
SOLUTION ELECTROLUBE (MISCELLANEOUS) ×3 IMPLANT
SUT VICRYL 0 UR6 27IN ABS (SUTURE) ×3 IMPLANT
SUT VICRYL 4-0 PS2 18IN ABS (SUTURE) ×3 IMPLANT
TOWEL OR 17X24 6PK STRL BLUE (TOWEL DISPOSABLE) ×6 IMPLANT
TRENDGUARD 450 HYBRID PRO PACK (MISCELLANEOUS)
TRENDGUARD 600 HYBRID PROC PK (MISCELLANEOUS) ×3
TROCAR XCEL NON-BLD 11X100MML (ENDOMECHANICALS) ×3 IMPLANT
WARMER LAPAROSCOPE (MISCELLANEOUS) ×3 IMPLANT
WATER STERILE IRR 1000ML POUR (IV SOLUTION) ×3 IMPLANT

## 2016-07-22 NOTE — Anesthesia Postprocedure Evaluation (Signed)
Anesthesia Post Note  Patient: Brittney Lester  Procedure(s) Performed: Procedure(s) (LRB): LAPAROSCOPIC TUBAL LIGATION (Bilateral) DILATATION & CURETTAGE/HYSTEROSCOPY WITH HYDROTHERMAL ABLATION (N/A)  Patient location during evaluation: PACU Anesthesia Type: General Level of consciousness: awake and alert, patient cooperative and oriented Pain management: pain level controlled Vital Signs Assessment: post-procedure vital signs reviewed and stable Respiratory status: spontaneous breathing, nonlabored ventilation and respiratory function stable Cardiovascular status: blood pressure returned to baseline and stable Postop Assessment: no signs of nausea or vomiting Anesthetic complications: no        Last Vitals:  Vitals:   07/22/16 0852 07/22/16 0900  BP: 117/64 123/64  Pulse: 70 69  Resp: 10 10  Temp: 36.8 C     Last Pain:  Vitals:   07/22/16 0601  TempSrc: Oral   Pain Goal: Patients Stated Pain Goal: 5 (07/22/16 0852)               Erling CruzJACKSON,E. Kamorie Aldous

## 2016-07-22 NOTE — Discharge Instructions (Signed)
DISCHARGE INSTRUCTIONS: Laparoscopy  The following instructions have been prepared to help you care for yourself upon your return home today.  Wound care:  Do not get the incision wet for the first 24 hours. The incision should be kept clean and dry.  The Band-Aids or dressings may be removed the day after surgery.  Should the incision become sore, red, and swollen after the first week, check with your doctor.  Personal hygiene:  Shower the day after your procedure.  Activity and limitations:  Do NOT drive or operate any equipment today.  Do NOT lift anything more than 15 pounds for 2-3 weeks after surgery.  Do NOT rest in bed all day.  Walking is encouraged. Walk each day, starting slowly with 5-minute walks 3 or 4 times a day. Slowly increase the length of your walks.  Walk up and down stairs slowly.  Do NOT do strenuous activities, such as golfing, playing tennis, bowling, running, biking, weight lifting, gardening, mowing, or vacuuming for 2-4 weeks. Ask your doctor when it is okay to start.  Diet: Eat a light meal as desired this evening. You may resume your usual diet tomorrow.  Return to work: This is dependent on the type of work you do. For the most part you can return to a desk job within a week of surgery. If you are more active at work, please discuss this with your doctor.  What to expect after your surgery: You may have a slight burning sensation when you urinate on the first day. You may have a very small amount of blood in the urine. Expect to have a small amount of vaginal discharge/light bleeding for 1-2 weeks. It is not unusual to have abdominal soreness and bruising for up to 2 weeks. You may be tired and need more rest for about 1 week. You may experience shoulder pain for 24-72 hours. Lying flat in bed may relieve it.  You may remove the patch behind your ear on or before Saturday 07/25/16. Wash your hands with soap and water after any contact with the  patch.  Call your doctor for any of the following:  Develop a fever of 100.4 or greater  Inability to urinate 6 hours after discharge from hospital  Severe pain not relieved by pain medications  Persistent of heavy bleeding at incision site  Redness or swelling around incision site after a week  Increasing nausea or vomiting  Patient Signature________________________________________ Nurse Signature_________________________________________

## 2016-07-22 NOTE — Brief Op Note (Signed)
07/22/2016  9:08 AM  PATIENT:  Brittney Lester  35 y.o. female  PRE-OPERATIVE DIAGNOSIS:  N92.0 Menorrhagia, Desires Sterilization  POST-OPERATIVE DIAGNOSIS:  Menorrhagia, Desires Sterilization  PROCEDURE:  Procedure(s): LAPAROSCOPIC TUBAL LIGATION (Bilateral) DILATATION & CURETTAGE/HYSTEROSCOPY WITH HYDROTHERMAL ABLATION (N/A)  SURGEON:  Surgeon(s) and Role:    * Gerald Leitzara Yuya Vanwingerden, MD - Primary  PHYSICIAN ASSISTANT:   ASSISTANTS: none   ANESTHESIA:   general  EBL: 10 cc   Total I/O In: 1000 [I.V.:1000] Out: 100 [Urine:90; Blood:10]  BLOOD ADMINISTERED:none  DRAINS: none   LOCAL MEDICATIONS USED:  MARCAINE     SPECIMEN:  Source of Specimen:  endometrial curettings  DISPOSITION OF SPECIMEN:  PATHOLOGY  COUNTS:  YES  TOURNIQUET:  * No tourniquets in log *  DICTATION: .Dragon Dictation  PLAN OF CARE: Discharge to home after PACU  PATIENT DISPOSITION:  PACU - hemodynamically stable.   Delay start of Pharmacological VTE agent (>24 hrs) due to surgical blood loss or risk of bleeding: not applicable  Procedure: The patient was taken to the operating room where she was placed under general anesthesia. She is placed in the dorsolithotomy position. She was prepped and draped in the usual sterile fashion. Speculum was placed into the vaginal vault. In the anterior lip of the cervix grasped with a single-tooth tenaculum. A uterine manipulator was placed without difficulty. The single-tooth tenaculum was  Removed. The speculum was removed.  Attention was turned to the umbilicus which was injected with 10 cc of quarter percent Marcaine. A 11 mm incision was made with the scalpel. A 11 mm trocar was placed under direct visualization. Entry into the peritoneum was visualized. The pneumoperitoneum was achieved with CO2 gas.  Her  fallopian tubes and ovaries appeared normal bilaterally.  Operative scope was inserted and the right fallopian tube was identified and followed out to the  fimbriated end. The a 2-3 cm portion of the right fallopian tube was cauterized with the Kleppinger  This was repeated on the left fallopian tube The Kleppinger  was removed. Laparoscopic scissors were  Used to excise a segment of the fallopian tube that was cauterized. This was performed on both fallopian tubes. A laparoscopic needle was inserted and 10 cc of quarter percent Marcaine was placed along the ampullary portion of the tubes  bilaterally. Pneumoperitoneum was released. The umbilical trocar was removed under direct visualization. The fascia was closed with 0 Vicryl. The skin was closed with 4-0 Vicryl. The derma bond placed a over the skin incision.  Attention was turned to the vagina were the uterine manipulator was removed. A speculum was placed in the vaginal vault. The anterior lip of the cervix was grasped with a single tooth tenaculum. The uterus was sounded to 8 cm. The cervix was dilated to 8 mm. The hydrothermal hysteroscope was inserted. The ostia were visualized bilaterally. There was no evidence of perforation. The hydrothermal ablation was performed for 10 minutes.  The hysteroscope was removed.  Sharp curette was used to obtain endometrial curetting. 10 cc of 25% marcaine was injected at the 4 and 8 o'clock position.  The single tooth tenaculum was removed. Bleeding was noted at the tenaculum site. The bleeding was controlled with silver nitrate. Excellent hemostasis was noted.   Sponge lap and needle counts were correct x 2.  The patient was awakened from anesthesia and taken to the recovery room in stable condition.   Patient was awakened from anesthesia taken to the recovery room awake and in stable condition.

## 2016-07-22 NOTE — Anesthesia Procedure Notes (Signed)
Procedure Name: Intubation Date/Time: 07/22/2016 7:36 AM Performed by: Janeece AgeeWRAPE, Yifan Auker W Pre-anesthesia Checklist: Patient identified, Emergency Drugs available, Suction available, Patient being monitored and Timeout performed Patient Re-evaluated:Patient Re-evaluated prior to inductionOxygen Delivery Method: Circle system utilized Preoxygenation: Pre-oxygenation with 100% oxygen Intubation Type: IV induction Ventilation: Mask ventilation without difficulty Laryngoscope Size: Mac and 3 Grade View: Grade II Tube type: Oral Tube size: 7.0 mm Number of attempts: 1 Airway Equipment and Method: Stylet Placement Confirmation: ETT inserted through vocal cords under direct vision,  positive ETCO2 and breath sounds checked- equal and bilateral Secured at: 21 cm Tube secured with: Tape Dental Injury: Teeth and Oropharynx as per pre-operative assessment

## 2016-07-22 NOTE — H&P (Signed)
Chief Complaint(s):   PreOp history and physical for 07/22/16   HPI:  General 35 y/o presents for preoperative history and physical exam in preparation for laparoscopic tubal ligation and hysteroscopy D&C with hydrothermal ablation. she has menorrhagia and desires permanent sterilization. she changes an overnight pad with wings every 30 minutes on her heaviest day. she has had hygiene accidents.  Her ultrsound 04/14/2016 showed an 8.4 cm x 5.5 cm x 4.7 cm uterus without anomalies. Her ovaries are normal bilaterally. Her endometrium meastures 8.5 mm.  Current Medication:  Discontinued  Lysteda 650 MG Tablet 2 tablets Orally Three times daily     Medication List reviewed and reconciled with the patient   Medical History:   seasonal allergies     hyperthyroidism/graves disease diagnosed at the age of 35 resolved.. however currently euthyroid      Allergies/Intolerance:   N.K.D.A.   Gyn History:   Sexual activity currently sexually active. Periods : regular but heavier. LMP 07/09/2016. Denies Birth control none. Last pap smear date 03/06/15. Last mammogram date N/A. H/O Abnormal pap smear yes, LGSIL 2001, ASCUS 2002. H/O STD HSV oral, HPV. Menarche 10. GYN procedures Colposcopy 2001, 2005, 2006, Laser surgery-VAIN.   OB History:   Number of pregnancies 3. Pregnancy # 1 01/06/09 , live birth, vaginal delivery, boy "Elijah", birth weight 8+ 8.5. Pregnancy # 2 12/31/10, boy, vaginal delivery, live birth, Phoebe PerchCarson James "CJ". Pregnancy # 3 04/12/13 , live birth, vaginal delivery, boy "Asa". NVD 1.   Surgical History:   T & A 1992     laser therapy to VAIN 06/2000     wisdom teeth   Hospitalization:   childbirth x 1 SVD 01/06/09     T & A     Childbirth 12/31/10     child birth 04/12/13   Family History:   Father: alive, hypoglycemia    Mother: alive, hypertension, thyroid disease    Paternal Grand Father: deceased    Paternal Grand Mother: deceased, died at in childbirth    Maternal Grand  Father: deceased, hypertension, heart attack    Maternal Grand Mother: deceased, COPD    Brother 1: alive    1 brother(s) . 3 son(s) .    No family history of colon, ovarian or breast cancer.  Social History:  General Tobacco use cigarettes: Never smoked, Tobacco history last updated 07/14/2016.  no EXPOSURE TO PASSIVE SMOKE.  no Alcohol.  Caffeine: yes, tea.  no Recreational drug use.  Marital Status: married.  Children: 3, son (s).  OCCUPATION: employed, Human resources officerspeech therapist.  Seat belt use: yes.  ROS: CONSTITUTIONAL none" options="no,yes" propid="91" itemid="[id number]" categoryid="10464" encounterid="[id number]"Fatigue none. none today" options="no,yes" propid="91" itemid="10467" categoryid="10464" encounterid="[id number]"Fever none today.  CARDIOLOGY none" options="no,yes" propid="91" itemid="[id number]" categoryid="10488" encounterid="[id number]"Chest pain none.  RESPIRATORY no" options="no" propid="91" itemid="270013" categoryid="138132" encounterid="[id number]"Shortness of breath no. no" options="no,yes" propid="91" itemid="172745" categoryid="138132" encounterid="[id number]"Cough no.  GASTROENTEROLOGY none" options="no,yes" propid="91" itemid="193447" categoryid="10494" encounterid="[id number]"Appetite change none. no" options="no,yes" propid="91" itemid="193449" categoryid="10494" encounterid="[id number]"Change in bowel habits no.  FEMALE REPRODUCTIVE no" options="no,yes" propid="91" itemid="196298" categoryid="10525" encounterid="[id number]"Breast lumps or discharge no. none" options="no,yes" propid="91" itemid="186083" categoryid="10525" encounterid="[id number]"Breast pain none. none" options="no,yes" propid="91" itemid="138198" categoryid="10525" encounterid="[id number]"Dyspareunia none. no" options="no,yes" propid="91" itemid="202654" categoryid="10525" encounterid="[id number]"Dysuria no. none" options="no,yes" propid="91" itemid="186082" categoryid="10525" encounterid="[id number]"Pelvic pain none. yes" options="no,yes"  propid="91" itemid="199173" categoryid="10525" encounterid="[id number]"Regular menses yes. no" options="no,yes" propid="91" itemid="278230" categoryid="10525" encounterid="[id number]"Unusual vaginal discharge no. no" options="no,yes" propid="91" itemid="278942" categoryid="10525" encounterid="[id number]"Vaginal itching no. no" options="no,yes" propid="91" itemid="278837" categoryid="10525" encounterid="[id number]"Vulvar/labial lesion no.  NEUROLOGY none" options="no,yes" propid="91" itemid="193627" categoryid="12512" encounterid="[id number]"Migraines none. none" options="no,yes" propid="91" itemid="12514"  categoryid="12512" encounterid="[id number]"Tingling/numbness none. none" options="no,yes" propid="91" itemid="193467" categoryid="12512" encounterid="[id number]"Visual changes none.  PSYCHOLOGY no" options="" propid="91" itemid="275919" categoryid="10520" encounterid="[id number]"Depression no.  SKIN no" options="no,yes" propid="91" itemid="269383" categoryid="202750" encounterid="[id number]"Rash no. no" options="no,yes" propid="91" itemid="202757" categoryid="202750" encounterid="[id number]"Suspicious lesions no.  ENDOCRINOLOGY none" options="no,yes" propid="91" itemid="202624" categoryid="12508" encounterid="[id number]"Hot flashes none. no unintentional" options="no,yes" propid="91" itemid="193436" categoryid="12508" encounterid="[id number]"Weight gain no unintentional. none" options="no,yes" propid="91" itemid="138164" categoryid="12508" encounterid="[id number]"Weight loss none.  HEMATOLOGY/LYMPH no" options="no,yes" propid="91" itemid="193454" categoryid="138157" encounterid="[id number]"Anemia no.    Objective: Vitals:  Wt 240, Wt change -13 lb, Ht 63.5, BMI 41.84, Temp 98.8, Pulse sitting 86, BP sitting 113/69  Past Results:  Examination:  Physical Examination: GENERAL in NAD, pleasant"Patient appears in NAD, pleasant. well developed"Build: well developed. overweight"General Appearance: overweight. caucasian"Race: caucasian.   LUNGS clear to auscultation"Breath sounds: clear to auscultation. no"Dyspnea: no.  HEART none"Murmurs: none. normal"Rate: normal. regular"Rhythm: regular.  ABDOMEN no masses,tenderness,organomegaly, no CVAT"General: no masses,tenderness,organomegaly, no CVAT.  FEMALE GENITOURINARY no mass, non tender"Adnexa: no mass, non tender. normal, no lesions"Anus/perineum: normal, no lesions. normal appearance , no lesions/discharge/bleeding,, good pelvic support , external os normal "Cervix/ cuff: normal appearance , no lesions/discharge/bleeding,, good pelvic support , external os normal . normal, no lesions, no skin discoloration, no lymphadenopathy"External genitalia: normal, no lesions, no skin discoloration, no lymphadenopathy. normal external meatus"Urethra: normal external meatus. normal size/shape/consistency, freely mobile, non tender"Uterus: normal size/shape/consistency, freely mobile, non tender. deferred"Rectum: deferred. pink/moist mucosa, no lesions, no abnormal discharge, odorless"Vagina: pink/moist mucosa, no lesions, no abnormal discharge, odorless. normal, no lesions, no skin discoloration, non tender"Vulva: normal, no lesions, no skin discoloration, non tender.  EXTREMITIES FROM of all extremities"Extremities FROM of all extremities.  NEUROLOGICAL normal"Gait: normal. alert and oriented x 3"Orientation: alert and oriented x 3.    Assessment: Assessment:  Menorrhagia with regular cycle - N92.0 (Primary)     Encounter for other contraceptive management - Z30.8     Plan: Treatment:  Menorrhagia with regular cycle  Notes: all options of management were reviewed. she desires to proceed with hysteroscopy D&C and hydrothermal ablation. r/b/a of surgery discussed including but not limited to infection, bleeding damage to surrounding organs, uterine perforation with need for further surgery. pt voiced understanding and desires to proceed.  Encounter for other contraceptive management   Notes: pt desires perrmanent sterilization/ all alternatives discussed. she desires laparoscopic bilateral tubal ligation. r/o surgery discussed including but not limited to infection/ bleeding damage to bowel bladder and surrounding organs with the need for further surgery, 1 % r/o failure of tubal ligation with 50 % r/o ectopic pregnancy if failure occurs discussd. pt voiced understanding.

## 2016-07-22 NOTE — Transfer of Care (Signed)
Immediate Anesthesia Transfer of Care Note  Patient: Brittney Lester  Procedure(s) Performed: Procedure(s): LAPAROSCOPIC TUBAL LIGATION (Bilateral) DILATATION & CURETTAGE/HYSTEROSCOPY WITH HYDROTHERMAL ABLATION (N/A)  Patient Location: PACU  Anesthesia Type:General  Level of Consciousness: awake, alert  and oriented  Airway & Oxygen Therapy: Patient Spontanous Breathing and Patient connected to nasal cannula oxygen  Post-op Assessment: Report given to RN, Post -op Vital signs reviewed and stable and Patient moving all extremities  Post vital signs: Reviewed and stable  Last Vitals:  Vitals:   07/22/16 0601  BP: 130/77  Pulse: 76  Resp: 16  Temp: 36.7 C    Last Pain:  Vitals:   07/22/16 0601  TempSrc: Oral      Patients Stated Pain Goal: 5 (07/22/16 0601)  Complications: No apparent anesthesia complications

## 2016-07-23 ENCOUNTER — Encounter (HOSPITAL_COMMUNITY): Payer: Self-pay | Admitting: Obstetrics and Gynecology

## 2020-02-20 ENCOUNTER — Ambulatory Visit (INDEPENDENT_AMBULATORY_CARE_PROVIDER_SITE_OTHER): Payer: Self-pay | Admitting: Family Medicine

## 2020-02-21 ENCOUNTER — Other Ambulatory Visit: Payer: Self-pay

## 2020-02-21 ENCOUNTER — Ambulatory Visit (INDEPENDENT_AMBULATORY_CARE_PROVIDER_SITE_OTHER): Payer: BC Managed Care – PPO | Admitting: Family Medicine

## 2020-02-21 ENCOUNTER — Encounter (INDEPENDENT_AMBULATORY_CARE_PROVIDER_SITE_OTHER): Payer: Self-pay | Admitting: Family Medicine

## 2020-02-21 VITALS — BP 112/76 | HR 86 | Temp 98.0°F | Ht 64.0 in | Wt 298.0 lb

## 2020-02-21 DIAGNOSIS — F329 Major depressive disorder, single episode, unspecified: Secondary | ICD-10-CM

## 2020-02-21 DIAGNOSIS — Z6841 Body Mass Index (BMI) 40.0 and over, adult: Secondary | ICD-10-CM

## 2020-02-21 DIAGNOSIS — E559 Vitamin D deficiency, unspecified: Secondary | ICD-10-CM | POA: Diagnosis not present

## 2020-02-21 DIAGNOSIS — R5383 Other fatigue: Secondary | ICD-10-CM

## 2020-02-21 DIAGNOSIS — Z8639 Personal history of other endocrine, nutritional and metabolic disease: Secondary | ICD-10-CM

## 2020-02-21 DIAGNOSIS — R0602 Shortness of breath: Secondary | ICD-10-CM

## 2020-02-21 DIAGNOSIS — R7303 Prediabetes: Secondary | ICD-10-CM

## 2020-02-21 DIAGNOSIS — F418 Other specified anxiety disorders: Secondary | ICD-10-CM | POA: Diagnosis not present

## 2020-02-21 DIAGNOSIS — Z9189 Other specified personal risk factors, not elsewhere classified: Secondary | ICD-10-CM

## 2020-02-21 DIAGNOSIS — F32A Depression, unspecified: Secondary | ICD-10-CM

## 2020-02-21 DIAGNOSIS — F419 Anxiety disorder, unspecified: Secondary | ICD-10-CM

## 2020-02-21 DIAGNOSIS — Z0289 Encounter for other administrative examinations: Secondary | ICD-10-CM

## 2020-02-22 LAB — CBC WITH DIFFERENTIAL/PLATELET
Basophils Absolute: 0 10*3/uL (ref 0.0–0.2)
Basos: 1 %
EOS (ABSOLUTE): 0.2 10*3/uL (ref 0.0–0.4)
Eos: 2 %
Hematocrit: 43.9 % (ref 34.0–46.6)
Hemoglobin: 14.5 g/dL (ref 11.1–15.9)
Immature Grans (Abs): 0 10*3/uL (ref 0.0–0.1)
Immature Granulocytes: 0 %
Lymphocytes Absolute: 1.9 10*3/uL (ref 0.7–3.1)
Lymphs: 27 %
MCH: 30.9 pg (ref 26.6–33.0)
MCHC: 33 g/dL (ref 31.5–35.7)
MCV: 93 fL (ref 79–97)
Monocytes Absolute: 0.5 10*3/uL (ref 0.1–0.9)
Monocytes: 7 %
Neutrophils Absolute: 4.4 10*3/uL (ref 1.4–7.0)
Neutrophils: 63 %
Platelets: 202 10*3/uL (ref 150–450)
RBC: 4.7 x10E6/uL (ref 3.77–5.28)
RDW: 13.1 % (ref 11.7–15.4)
WBC: 7 10*3/uL (ref 3.4–10.8)

## 2020-02-22 LAB — T4: T4, Total: 8.8 ug/dL (ref 4.5–12.0)

## 2020-02-22 LAB — COMPREHENSIVE METABOLIC PANEL
ALT: 27 IU/L (ref 0–32)
AST: 22 IU/L (ref 0–40)
Albumin/Globulin Ratio: 2 (ref 1.2–2.2)
Albumin: 4.8 g/dL (ref 3.8–4.8)
Alkaline Phosphatase: 80 IU/L (ref 48–121)
BUN/Creatinine Ratio: 14 (ref 9–23)
BUN: 11 mg/dL (ref 6–24)
Bilirubin Total: 1.7 mg/dL — ABNORMAL HIGH (ref 0.0–1.2)
CO2: 23 mmol/L (ref 20–29)
Calcium: 9.4 mg/dL (ref 8.7–10.2)
Chloride: 102 mmol/L (ref 96–106)
Creatinine, Ser: 0.81 mg/dL (ref 0.57–1.00)
GFR calc Af Amer: 104 mL/min/{1.73_m2} (ref >59)
GFR calc non Af Amer: 90 mL/min/{1.73_m2} (ref >59)
Globulin, Total: 2.4 g/dL (ref 1.5–4.5)
Glucose: 84 mg/dL (ref 65–99)
Potassium: 4.1 mmol/L (ref 3.5–5.2)
Sodium: 140 mmol/L (ref 134–144)
Total Protein: 7.2 g/dL (ref 6.0–8.5)

## 2020-02-22 LAB — HEMOGLOBIN A1C
Est. average glucose Bld gHb Est-mCnc: 94 mg/dL
Hgb A1c MFr Bld: 4.9 % (ref 4.8–5.6)

## 2020-02-22 LAB — INSULIN, RANDOM: INSULIN: 14 u[IU]/mL (ref 2.6–24.9)

## 2020-02-22 LAB — LIPID PANEL
Chol/HDL Ratio: 3.3 ratio (ref 0.0–4.4)
Cholesterol, Total: 167 mg/dL (ref 100–199)
HDL: 51 mg/dL (ref >39)
LDL Chol Calc (NIH): 97 mg/dL (ref 0–99)
Triglycerides: 102 mg/dL (ref 0–149)
VLDL Cholesterol Cal: 19 mg/dL (ref 5–40)

## 2020-02-22 LAB — T3: T3, Total: 125 ng/dL (ref 71–180)

## 2020-02-22 LAB — VITAMIN B12: Vitamin B-12: 531 pg/mL (ref 232–1245)

## 2020-02-22 LAB — TSH: TSH: 0.92 u[IU]/mL (ref 0.450–4.500)

## 2020-02-22 LAB — VITAMIN D 25 HYDROXY (VIT D DEFICIENCY, FRACTURES): Vit D, 25-Hydroxy: 24.8 ng/mL — ABNORMAL LOW (ref 30.0–100.0)

## 2020-02-22 LAB — FOLATE: Folate: 11.6 ng/mL (ref >3.0)

## 2020-02-22 NOTE — Progress Notes (Signed)
Chief Complaint:   OBESITY Brittney Lester (MR# [id number]) is a 39 y.o. female who presents for evaluation and treatment of obesity and related comorbidities. Current BMI is Body mass index is 51.15 kg/m. Brittney Lester has been struggling with her weight for many years and has been unsuccessful in either losing weight, maintaining weight loss, or reaching her healthy weight goal.  Brittney Lester is currently in the action stage of change and ready to dedicate time achieving and maintaining a healthier weight. Brittney Lester is interested in becoming our patient and working on intensive lifestyle modifications including (but not limited to) diet and exercise for weight loss.  Brittney Lester lives with her 3 children and her mother and father.  She is a Doctor, general practice.  She says she eats out every day of the week.  She craves carbs, especially in the last afternoon.  She says she snacks on popcorn and ice cream.  She skips breakfast 3-5 days per week.  She drinks caloric beverages everyday.  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  Brittney Lester's habits were reviewed today and are as follows: Her family eats meals together, she thinks her family will eat healthier with her, her desired weight loss is 120 pounds, she has been heavy most of her life, she started gaining weight after pregnancy, her heaviest weight ever was 305 pounds, she craves carbs, she skips breakfast frequently, she frequently makes poor food choices, she frequently eats larger portions than normal and she struggles with emotional eating.  Depression Screen Brittney Lester's Food and Mood (modified PHQ-9) score was 14.  Depression screen [facility] 2/9 02/21/2020  Decreased Interest 2  Down, Depressed, Hopeless 3  PHQ - 2 Score 5  Altered sleeping 1  Tired, decreased energy 3  Change in appetite 3  Feeling bad or failure about yourself  1  Trouble concentrating 0  Moving slowly or fidgety/restless 1  Suicidal thoughts 0    PHQ-9 Score 14   Subjective:   1. Other fatigue Brittney Lester admits to daytime somnolence and reports waking up still tired. Brittney Lester has a history of symptoms of daytime fatigue, morning fatigue and snoring. Brittney Lester generally gets 6 or 7 hours of sleep per night, and states that she has generally restful sleep. Snoring is present. Apneic episodes are not present. Epworth Sleepiness Score is 5.  2. SOB (shortness of breath) on exertion Brittney Lester notes increasing shortness of breath with exercising and seems to be worsening over time with weight gain. She notes getting out of breath sooner with activity than she used to. This has gotten worse recently. Brittney Lester denies shortness of breath at rest or orthopnea.  3. Vitamin D deficiency She is currently taking OTC vitamin D 1000 IU each day. She denies nausea, vomiting or muscle weakness.  4. Prediabetes Brittney Lester has a diagnosis of prediabetes based on her elevated HgA1c and was informed this puts her at greater risk of developing diabetes. She continues to work on diet and exercise to decrease her risk of diabetes. She denies nausea or hypoglycemia.  She has history of gestational diabetes.  She has not had labs checked in years.  Lab Results  Component Value Date   HGBA1C 4.9 02/21/2020   Lab Results  Component Value Date   INSULIN 14.0 02/21/2020   5. History of Graves' disease In the past, she required medication to keep her thyroid level within normal limits.  She endorses fatigue and shortness of breath  still.  Lab Results  Component Value Date   TSH 0.920 02/21/2020   6. Anxiety and depression, with emotional eating Brittney Lester is struggling with emotional eating and using food for comfort to the extent that it is negatively impacting her health. She has been working on behavior modification techniques to help reduce her emotional eating and has been unsuccessful. She shows no sign of suicidal or homicidal ideations.  7. At risk for diabetes  mellitus Brittney Lester is at higher than average risk for developing diabetes due to her obesity.   Assessment/Plan:   1. Other fatigue Brittney Lester does feel that her weight is causing her energy to be lower than it should be. Fatigue may be related to obesity, depression or many other causes. Labs will be ordered, and in the meanwhile, Brittney Lester will focus on self care including making healthy food choices, increasing physical activity and focusing on stress reduction. - EKG 12-Lead - Vitamin B12 - CBC with Differential/Platelet - Folate - T3 - TSH - T4  2. SOB (shortness of breath) on exertion Brittney Lester does feel that she gets out of breath more easily that she used to when she exercises. Brittney Lester's shortness of breath appears to be obesity related and exercise induced. She has agreed to work on weight loss and gradually increase exercise to treat her exercise induced shortness of breath. Will continue to monitor closely.  3. Vitamin D deficiency Low Vitamin D level contributes to fatigue and are associated with obesity, breast, and colon cancer.  Will check vitamin D level today. - VITAMIN D 25 Hydroxy (Vit-D Deficiency, Fractures)  4. Prediabetes Brittney Lester will continue to work on weight loss, exercise, and decreasing simple carbohydrates to help decrease the risk of diabetes.  Check labs today. - Hemoglobin A1c - Insulin, random - Comprehensive metabolic panel - Lipid panel  5. History of Graves' disease Will check labs today. - T3 - TSH - T4  6. Anxiety and depression, with emotional eating Behavior modification techniques were discussed today to help Brittney Lester deal with her emotional/non-hunger eating behaviors.  Orders and follow up as documented in patient record.    7. At risk for diabetes mellitus Brittney Lester was given approximately 15 minutes of diabetes education and counseling today. We discussed intensive lifestyle modifications today with an emphasis on weight loss as well as  increasing exercise and decreasing simple carbohydrates in her diet. We also reviewed medication options with an emphasis on risk versus benefit of those discussed.   Repetitive spaced learning was employed today to elicit superior memory formation and behavioral change.  8. Class 3 severe obesity with serious comorbidity and body mass index (BMI) of 50.0 to 59.9 in adult, unspecified obesity type (HCC) Brittney Lester is currently in the action stage of change and her goal is to continue with weight loss efforts. I recommend Brittney Lester begin the structured treatment plan as follows:  She has agreed to the Category 2 Plan.  Exercise goals: As is.   Behavioral modification strategies: increasing lean protein intake, no skipping meals, keeping healthy foods in the home and planning for success.  She was informed of the importance of frequent follow-up visits to maximize her success with intensive lifestyle modifications for her multiple health conditions. She was informed we would discuss her lab results at her next visit unless there is a critical issue that needs to be addressed sooner. Brittney Lester agreed to keep her next visit at the agreed upon time to discuss these results.  Objective:   Blood pressure 112/76, pulse  86, temperature 98 F (36.7 C), temperature source Oral, height 5\' 4"  (1.626 m), weight (!) 298 lb (135.2 kg), SpO2 97 %, unknown if currently breastfeeding. Body mass index is 51.15 kg/m.  EKG: Normal sinus rhythm, rate 85 bpm.  Indirect Calorimeter completed today shows a VO2 of 321 and a REE of 2234.  Her calculated basal metabolic rate is 2235 thus her basal metabolic rate is better than expected.  General: Cooperative, alert, well developed, in no acute distress. HEENT: Conjunctivae and lids unremarkable. Cardiovascular: Regular rhythm.  Lungs: Normal work of breathing. Neurologic: No focal deficits.   Lab Results  Component Value Date   CREATININE 0.81 02/21/2020   BUN 11  02/21/2020   NA 140 02/21/2020   K 4.1 02/21/2020   CL 102 02/21/2020   CO2 23 02/21/2020   Lab Results  Component Value Date   ALT 27 02/21/2020   AST 22 02/21/2020   ALKPHOS 80 02/21/2020   BILITOT 1.7 (H) 02/21/2020   Lab Results  Component Value Date   WBC 7.0 02/21/2020   HGB 14.5 02/21/2020   HCT 43.9 02/21/2020   MCV 93 02/21/2020   PLT 202 02/21/2020   Attestation Statements:   Reviewed by clinician on day of visit: allergies, medications, problem list, medical history, surgical history, family history, social history, and previous encounter notes.  I, 02/23/2020, CMA, am acting as Insurance claims handler for Energy manager, DO.  I have reviewed the above documentation for accuracy and completeness, and I agree with the above. [employer], DO

## 2020-03-04 ENCOUNTER — Other Ambulatory Visit: Payer: Self-pay

## 2020-03-04 ENCOUNTER — Ambulatory Visit (INDEPENDENT_AMBULATORY_CARE_PROVIDER_SITE_OTHER): Payer: BC Managed Care – PPO | Admitting: Family Medicine

## 2020-03-04 ENCOUNTER — Encounter (INDEPENDENT_AMBULATORY_CARE_PROVIDER_SITE_OTHER): Payer: Self-pay | Admitting: Family Medicine

## 2020-03-04 VITALS — BP 115/79 | HR 92 | Temp 98.6°F | Ht 64.0 in | Wt 299.0 lb

## 2020-03-04 DIAGNOSIS — F418 Other specified anxiety disorders: Secondary | ICD-10-CM | POA: Diagnosis not present

## 2020-03-04 DIAGNOSIS — E8881 Metabolic syndrome: Secondary | ICD-10-CM

## 2020-03-04 DIAGNOSIS — Z9189 Other specified personal risk factors, not elsewhere classified: Secondary | ICD-10-CM

## 2020-03-04 DIAGNOSIS — Z6841 Body Mass Index (BMI) 40.0 and over, adult: Secondary | ICD-10-CM

## 2020-03-04 DIAGNOSIS — E559 Vitamin D deficiency, unspecified: Secondary | ICD-10-CM | POA: Diagnosis not present

## 2020-03-04 MED ORDER — VITAMIN D (ERGOCALCIFEROL) 1.25 MG (50000 UNIT) PO CAPS: 50000.0000 [IU] | ORAL_CAPSULE | ORAL | 0 refills | Status: DC

## 2020-03-05 ENCOUNTER — Ambulatory Visit (INDEPENDENT_AMBULATORY_CARE_PROVIDER_SITE_OTHER): Payer: Self-pay | Admitting: Family Medicine

## 2020-03-05 NOTE — Progress Notes (Signed)
Chief Complaint:   OBESITY Brittney Lester is here to discuss her progress with her obesity treatment plan along with follow-up of her obesity related diagnoses. Brittney Lester is on the Category 2 Plan and states she is following her eating plan approximately 30% of the time. Brittney Lester states she is exercising for 0 minutes 0 times per week.  Today's visit was #: 2 Starting weight: 298 lbs Starting date: 02/21/2020 Today's weight: 299 lbs Today's date: 03/04/2020 Total lbs lost to date: 0 Total lbs lost since last in-office visit: 0  Interim History: Brittney Lester went to the beach recently for several days.  Tomorrow, she starts back to work and will have more of a routine.  She says that her 3 sons will be with her full time now.  She reports that it was difficult to meal prep and plan over the last 2 weeks.  She needs ideas for meals.    Subjective:   1. Insulin resistance Brittney Lester has a diagnosis of insulin resistance based on her elevated fasting insulin level >5. She continues to work on diet and exercise to decrease her risk of diabetes.  Lab Results  Component Value Date   INSULIN 14.0 02/21/2020   Lab Results  Component Value Date   HGBA1C 4.9 02/21/2020   2. Vitamin D deficiency Brittney Lester's Vitamin D level was 24.8 on 02/21/2020. She is currently taking OTC vitamin D 1000 IU each day. She denies nausea, vomiting or muscle weakness.  She is not consistent with her OTC supplement.  3. Depression with anxiety Brittney Lester is struggling with emotional eating and using food for comfort to the extent that it is negatively impacting her health. She has been working on behavior modification techniques to help reduce her emotional eating and has been unsuccessful. She shows no sign of suicidal or homicidal ideations.  She was on Lexapro about 1-2 years ago when she went through a divorce.  She does not feel she needs any help now.  She feels emotionally stable and has a supportive boyfriend.  4. At risk for  diabetes mellitus Brittney Lester is at higher than average risk for developing diabetes due to her obesity.   Assessment/Plan:   1. Insulin resistance New.  Discussed labs with patient today.  Quilla will continue to work on weight loss, exercise, and decreasing simple carbohydrates to help decrease the risk of diabetes. Shivon agreed to follow-up with [place] as directed to closely monitor her progress.  Recheck labs in 3 months.  2. Vitamin D deficiency Low Vitamin D level contributes to fatigue and are associated with obesity, breast, and colon cancer. She agrees to start to take prescription Vitamin D @50 ,000 IU every week and will follow-up for routine testing of Vitamin D, at least 2-3 times per year to avoid over-replacement.  Recheck level in 3 months.  Prudent nutritional plan, weight loss. - Vitamin D, Ergocalciferol, (DRISDOL) 1.25 MG (50000 UNIT) CAPS capsule; Take 1 capsule (50,000 Units total) by mouth every 7 (seven) days.  Dispense: 4 capsule; Refill: 0  3. Depression with anxiety Patient was referred to Dr. , our Bariatric Psychologist, for evaluation due to her elevated PHQ-9 score and significant struggles with emotional eating.  4. At risk for diabetes mellitus Brittney Lester was given approximately 15 minutes of diabetes education and counseling today. We discussed intensive lifestyle modifications today with an emphasis on weight loss as well as increasing exercise and decreasing simple carbohydrates in her diet. We also reviewed medication options with an emphasis on risk  versus benefit of those discussed.   Repetitive spaced learning was employed today to elicit superior memory formation and behavioral change.  5. Class 3 severe obesity with serious comorbidity and body mass index (BMI) of 50.0 to 59.9 in adult, unspecified obesity type (HCC) Brittney Lester is currently in the action stage of change. As such, her goal is to continue with weight loss efforts. She has agreed to the Category  2 Plan.   Handouts on meal prep ideas and recipes given after long discussion with her on ideas/ways to plan.  Exercise goals: As is.  Behavioral modification strategies: increasing lean protein intake, meal planning and cooking strategies, avoiding temptations and planning for success.  Brittney Lester has agreed to follow-up with our clinic in 2 weeks. She was informed of the importance of frequent follow-up visits to maximize her success with intensive lifestyle modifications for her multiple health conditions.   Objective:   Blood pressure 115/79, pulse 92, temperature 98.6 F (37 C), height 5\' 4"  (1.626 m), weight 299 lb (135.6 kg), SpO2 96 %, unknown if currently breastfeeding. Body mass index is 51.32 kg/m.  General: Cooperative, alert, well developed, in no acute distress. HEENT: Conjunctivae and lids unremarkable. Cardiovascular: Regular rhythm.  Lungs: Normal work of breathing. Neurologic: No focal deficits.   Lab Results  Component Value Date   CREATININE 0.81 02/21/2020   BUN 11 02/21/2020   NA 140 02/21/2020   K 4.1 02/21/2020   CL 102 02/21/2020   CO2 23 02/21/2020   Lab Results  Component Value Date   ALT 27 02/21/2020   AST 22 02/21/2020   ALKPHOS 80 02/21/2020   BILITOT 1.7 (H) 02/21/2020   Lab Results  Component Value Date   HGBA1C 4.9 02/21/2020   Lab Results  Component Value Date   INSULIN 14.0 02/21/2020   Lab Results  Component Value Date   TSH 0.920 02/21/2020   Lab Results  Component Value Date   CHOL 167 02/21/2020   HDL 51 02/21/2020   LDLCALC 97 02/21/2020   TRIG 102 02/21/2020   CHOLHDL 3.3 02/21/2020   Lab Results  Component Value Date   WBC 7.0 02/21/2020   HGB 14.5 02/21/2020   HCT 43.9 02/21/2020   MCV 93 02/21/2020   PLT 202 02/21/2020   Attestation Statements:   Reviewed by clinician on day of visit: allergies, medications, problem list, medical history, surgical history, family history, social history, and previous  encounter notes.  I, 02/23/2020, CMA, am acting as Insurance claims handler for Energy manager, DO.  I have reviewed the above documentation for accuracy and completeness, and I agree with the above. [employer], DO

## 2020-03-06 NOTE — Progress Notes (Signed)
Office: [phone]  /  Fax: [phone]    Date: March 20, 2020   Appointment Start Time: 3:01pm Duration: 36 minutes Provider: Lawerance Cruel, Psy.D. Type of Session: Intake for Individual Therapy  Location of Patient: Work Location of Provider: Provider's Home Type of Contact: Telepsychological Visit via MyChart Video Visit  Informed Consent: Prior to proceeding with today's appointment, two pieces of identifying information were obtained. In addition, Brittney Lester's physical location at the time of this appointment was obtained as well a phone number she could be reached at in the event of technical difficulties. Deloyce and this provider participated in today's telepsychological service.   The provider's role was explained to Brittney Lester. The provider reviewed and discussed issues of confidentiality, privacy, and limits therein (e.g., reporting obligations). In addition to verbal informed consent, written informed consent for psychological services was obtained prior to the initial appointment. Since the clinic is not a 24/7 crisis center, mental health emergency resources were shared and this  provider explained MyChart, e-mail, voicemail, and/or other messaging systems should be utilized only for non-emergency reasons. This provider also explained that information obtained during appointments will be placed in Brittney Lester's medical record and relevant information will be shared with other providers at [facility] for coordination of care. Moreover, Brittney Lester agreed information may be shared with other [facility] providers as needed for coordination of care. By signing the service agreement document, Brittney Lester provided written consent for coordination of care. Prior to initiating telepsychological services, Brittney Lester completed an informed consent document, which included the development of a safety plan (i.e., an emergency contact, nearest emergency room, and emergency  resources) in the event of an emergency/crisis. Brittney Lester expressed understanding of the rationale of the safety plan. Brittney Lester verbally acknowledged understanding she is ultimately responsible for understanding her insurance benefits for telepsychological and in-person services. This provider also reviewed confidentiality, as it relates to telepsychological services, as well as the rationale for telepsychological services (i.e., to reduce exposure risk to COVID-19). Brittney Lester  acknowledged understanding that appointments cannot be recorded without both party consent and she is aware she is responsible for securing confidentiality on her end of the session. Shannan verbally consented to proceed.  Chief Complaint/HPI: [name] was referred by Dr. Thomasene Lot due to depression with anxiety. Per the note for the visit with Dr. Thomasene Lot on March 04, 2020, "[name] is struggling with emotional eating and using food for comfort to the extent that it is negatively impacting her health. She has been working on behavior modification techniques to help reduce her emotional eating and has been unsuccessful. She shows no sign of suicidal or homicidal ideations.  She was on Lexapro about 1-2 years ago when she went through a divorce.  She does not feel she needs any help now.  She feels emotionally stable and has a supportive boyfriend."   During today's appointment, Brittney Lester was verbally administered a questionnaire assessing various behaviors related to emotional eating. Brittney Lester endorsed the following: overeat when you are celebrating, eat certain foods when you are anxious, stressed, depressed, or your feelings are hurt, use food to help you cope with emotional situations, find food is comforting to you, overeat when you are worried about something, not worry about what you eat when you are in a good mood, overeat when you are alone, but eat much less when you are with other people and eat as a reward. She shared she  craves sweets (e.g., cake). Brittney Lester believes the onset of emotional  eating was likely in childhood and described the current frequency of emotional eating as "at least once a week." In addition, Brittney Lester denied a history of binge eating. She described a history of skipping meals resulting in her consuming larger portion later in the day. Brittney Lester denied a history of restricting food intake, purging and engagement in other compensatory strategies, and has never been diagnosed with an eating disorder. She also denied a history of treatment for emotional eating. Currently, Brittney Lester indicated fighting with her boyfriend and children stressing her out triggers emotional eating, whereas having more energy and feeling better makes emotional eating better. Furthermore, Brittney Lester shared an improvement in symptoms of depression and anxiety since her divorce.   Mental Status Examination:  Appearance: well groomed and appropriate hygiene  Behavior: appropriate to circumstances Mood: euthymic Affect: mood congruent Speech: normal in rate, volume, and tone Eye Contact: appropriate Psychomotor Activity: appropriate Gait: unable to assess Thought Process: linear, logical, and goal directed  Thought Content/Perception: denies suicidal and homicidal ideation, plan, and intent and no hallucinations, delusions, bizarre thinking or behavior reported or observed Orientation: time, person, place, and purpose of appointment Memory/Concentration: memory, attention, language, and fund of knowledge intact  Insight/Judgment: good  Family & Psychosocial History: Brittney Lester reported she is in a relationship and she has three sons (ages 64, 46, and 7). She noted a history of divorce 2.5 years ago. 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 Currently, Brittney Lester's social support system consists of her  friends at church, boyfriend, and parents. Moreover, Brittney Lester stated she resides with her parents and children.   Medical History:  Past Medical History:  Diagnosis Date  . Anxiety   . Depression   . Depression   . Diabetes mellitus without complication (HCC)    gestational  . Family history of adverse reaction to anesthesia    mother has nausea post surgery  . Fatigue   . Graves disease    no longer need medication treatment  . Graves disease   . HPV in female   . Joint pain   . Obesity   . Palpitations   . Prediabetes   . Swelling    Past Surgical History:  Procedure Laterality Date  . COLPOSCOPY    . DILITATION & CURRETTAGE/HYSTROSCOPY WITH HYDROTHERMAL ABLATION N/A 07/22/2016   Procedure: DILATATION & CURETTAGE/HYSTEROSCOPY WITH HYDROTHERMAL ABLATION;  Surgeon: Gerald Leitz, MD;  Location: [facility];  Service: Gynecology;  Laterality: N/A;  . LAPAROSCOPIC TUBAL LIGATION Bilateral 07/22/2016   Procedure: LAPAROSCOPIC TUBAL LIGATION;  Surgeon: Gerald Leitz, MD;  Location: [facility];  Service: Gynecology;  Laterality: Bilateral;  . TONSILLECTOMY     Current Outpatient Medications on File Prior to Visit  Medication Sig Dispense Refill  . Calcium Carbonate (CALCIUM 500 PO) Take 1 capsule by mouth daily.    . cholecalciferol (VITAMIN D3) 25 MCG (1000 UNIT) tablet Take 1,000 Units by mouth daily.    Marland Kitchen KRILL OIL PO Take 1 capsule by mouth daily.    . Nutritional Supplements (JUICE PLUS FIBRE PO) Take 12 each by mouth every morning. Take 12 gummies by mouth daily.    Marland Kitchen OVER THE COUNTER MEDICATION Plexus Biocleanse 2 caps daily    . OVER THE COUNTER MEDICATION Vita Biome 1 cap daily    . OVER THE COUNTER MEDICATION Probio 5 2 caps daily    . UNABLE TO FIND Med Name:  5HTP 1 cap DAILY (ANXIETY/MOOD)    . Vitamin D, Ergocalciferol, (DRISDOL) 1.25 MG (50000 UNIT) CAPS capsule Take 1 capsule (50,000 Units total) by mouth every 7 (seven) days. 4 capsule 0   No current facility-administered  medications on file prior to visit.  Porshia denied a history of head injuries and loss of consciousness.    Mental Health History: Takya denied a history of therapeutic services. Previously, she noted she was prescribed Lexapro. She reported she currently takes 5 HTP OTC. Cindia reported there is no history of hospitalizations for psychiatric concerns. Margerite denied a family history of mental health related concerns. Tinaya reported there is no history of trauma including psychological, physical  and sexual abuse, as well as neglect.   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 She denied a history of suicidal plan and intent. Journei reported she last experienced suicidal ideation 7 years ago. The following protective factors were identified for Teresina: children, parents, and religion. If she were to become overwhelmed in the future, which is a sign that a crisis may occur, she identified the following coping skills she could engage in: go for a walk; focus on breathing; listen to music; and lay down. It was recommended the aforementioned be written down and developed into a coping card for future reference; she was observed writing. Psychoeducation regarding the importance of reaching out to a trusted individual and/or utilizing emergency resources if there is a change in emotional status and/or there is an inability to ensure safety was provided. Cleopha's confidence in reaching out to a trusted individual and/or utilizing emergency resources should there be an intensification in emotional status and/or there is an inability to ensure safety was assessed on a scale of one to ten where one is not confident and ten is extremely confident. She reported her confidence is a 10. Additionally, Tasheba denied current access to firearms and/or weapons.   Marabeth described her typical mood lately as  "pretty jovial." Aside from concerns noted above and endorsed on the PHQ-9 and GAD-7, Solveig reported experiencing decreased motivation due to the pandemic, noting it is starting to improve since starting the clinic. Lucyle endorsed social alcohol use (i.e., a couple standard drinks once a month). She denied tobacco use. She denied illicit/recreational substance use. Regarding caffeine intake, Nayzeth reported consuming diet Dr. Reino Kent and BAI drinks daily. Furthermore, Galya indicated she is not experiencing the following: hallucinations and delusions, paranoia, symptoms of mania , social withdrawal, crying spells and panic attacks. She also denied current suicidal ideation, plan, and intent; history of and current homicidal ideation, plan, and intent; and history of and current engagement in self-harm.  The following strengths were reported by [facility]: laid back, go with th flow, work well with others, and reflective. The following strengths were observed by this provider: ability to express thoughts and feelings during the therapeutic session, ability to establish and benefit from a therapeutic relationship, willingness to work toward established goal(s) with the clinic and ability to engage in reciprocal conversation.   Legal History: Libertie reported there is no history of legal involvement.   Structured Assessments Results: The Patient Health Questionnaire-9 (PHQ-9) is a self-report measure that assesses symptoms and severity of depression over the course of the last two weeks. Malkia obtained a score of 1 suggesting minimal depression. Deja finds the endorsed symptoms to be not difficult at all. [0= Not at all; 1= Several days; 2=  More than half the days; 3= Nearly every day] Little interest or pleasure in doing things 0  Feeling down, depressed, or hopeless 0  Trouble falling or staying asleep, or sleeping too much 0  Feeling tired or having little energy 0  Poor appetite or overeating 0    Feeling bad about yourself --- or that you are a failure or have let yourself or your family down 1  Trouble concentrating on things, such as reading the newspaper or watching television 0  Moving or speaking so slowly that other people could have noticed? Or the opposite --- being so fidgety or restless that you have been moving around a lot more than usual 0  Thoughts that you would be better off dead or hurting yourself in some way 0  PHQ-9 Score 1    The Generalized Anxiety Disorder-7 (GAD-7) is a brief self-report measure that assesses symptoms of anxiety over the course of the last two weeks. Dorene Grebeatalie obtained a score of 1 suggesting minimal anxiety. Dorene Grebeatalie finds the endorsed symptoms to be not difficult at all. [0= Not at all; 1= Several days; 2= Over half the days; 3= Nearly every day] Feeling nervous, anxious, on edge 1  Not being able to stop or control worrying 0  Worrying too much about different things 0  Trouble relaxing 0  Being so restless that it's hard to sit still 0  Becoming easily annoyed or irritable 0  Feeling afraid as if something awful might happen 0  GAD-7 Score 1   Interventions:  Conducted a chart review Focused on rapport building Verbally administered PHQ-9 and GAD-7 for symptom monitoring Verbally administered Food & Mood questionnaire to assess various behaviors related to emotional eating Provided emphatic reflections and validation Collaborated with patient on a treatment goal  Psychoeducation provided regarding physical versus emotional hunger Conducted a risk assessment Developed a coping card  Provisional DSM-5 Diagnosis(es): 307.59 (F50.8) Other Specified Feeding or Eating Disorder, Emotional Eating Behaviors   Plan: Dorene Grebeatalie appears able and willing to participate as evidenced by collaboration on a treatment goal, engagement in reciprocal conversation, and asking questions as needed for clarification. The next appointment will be scheduled in  three weeks, which will be via MyChart Video Visit. The following treatment goal was established: increase coping skills. This provider will regularly review the treatment plan and medical chart to keep informed of status changes. Dorene Grebeatalie expressed understanding and agreement with the initial treatment plan of care. Dorene Grebeatalie will be sent a handout via e-mail to utilize between now and the next appointment to increase awareness of hunger patterns and subsequent eating. Dorene Grebeatalie provided verbal consent during today's appointment for this provider to send the handout via e-mail.

## 2020-03-20 ENCOUNTER — Ambulatory Visit (INDEPENDENT_AMBULATORY_CARE_PROVIDER_SITE_OTHER): Payer: BC Managed Care – PPO | Admitting: Family Medicine

## 2020-03-20 ENCOUNTER — Telehealth (INDEPENDENT_AMBULATORY_CARE_PROVIDER_SITE_OTHER): Payer: BC Managed Care – PPO | Admitting: Psychology

## 2020-03-20 ENCOUNTER — Other Ambulatory Visit: Payer: Self-pay

## 2020-03-20 DIAGNOSIS — F5089 Other specified eating disorder: Secondary | ICD-10-CM | POA: Diagnosis not present

## 2020-03-27 NOTE — Progress Notes (Unsigned)
Office: [phone]  /  Fax: [phone]    Date: April 10, 2020   Appointment Start Time: *** Duration: *** minutes Provider: Lawerance Cruel, Psy.D. Type of Session: Individual Therapy  Location of Patient: {gbptloc:23249} Location of Provider: Provider's Home Type of Contact: Telepsychological Visit via MyChart Video Visit  Session Content: This provider called Lovette at 4:32pm as she did not present for the telepsychological appointment. A HIPAA compliant voicemail was left requesting a call back.  As such, today's appointment was initiated *** minutes late.  Brittney Lester is a 39 y.o. female presenting for a follow-up appointment to address the previously established treatment goal of increasing coping skills. Today's appointment was a telepsychological visit due to COVID-19. [name] provided verbal consent for today's telepsychological appointment and she is aware she is responsible for securing confidentiality on her end of the session. Prior to proceeding with today's appointment, [name]'s physical location at the time of this appointment was obtained as well a phone number she could be reached at in the event of technical difficulties. Zunaira and this provider participated in today's telepsychological service.   This provider conducted a brief check-in and verbally administered the PHQ-9 and GAD-7. ***Psychoeducation regarding triggers for emotional eating was provided. [name] was provided a handout, and encouraged to utilize the handout between now and the next appointment to increase awareness of triggers and frequency. [name] agreed. This provider also discussed behavioral strategies for specific triggers, such as placing the utensil down when conversing to avoid mindless eating. [name] provided verbal consent during today's appointment for this provider to send a handout about triggers via e-mail. [name] was receptive to today's appointment as evidenced by openness to sharing,  responsiveness to feedback, and {gbreceptiveness:23401}.  Mental Status Examination:  Appearance: {Appearance:22431} Behavior: {Behavior:22445} Mood: {gbmood:21757} Affect: {Affect:22436} Speech: {Speech:22432} Eye Contact: {Eye Contact:22433} Psychomotor Activity: {Motor Activity:22434} Gait: {gbgait:23404} Thought Process: {thought process:22448}  Thought Content/Perception: {disturbances:22451} Orientation: {Orientation:22437} Memory/Concentration: {gbcognition:22449} Insight/Judgment: {Insight:22446}  Structured Assessments Results: The Patient Health Questionnaire-9 (PHQ-9) is a self-report measure that assesses symptoms and severity of depression over the course of the last two weeks. [name] obtained a score of *** suggesting {GBPHQ9SEVERITY:21752}. [name] finds the endorsed symptoms to be {gbphq9difficulty:21754}. [0= Not at all; 1= Several days; 2= More than half the days; 3= Nearly every day] Little interest or pleasure in doing things ***  Feeling down, depressed, or hopeless ***  Trouble falling or staying asleep, or sleeping too much ***  Feeling tired or having little energy ***  Poor appetite or overeating ***  Feeling bad about yourself --- or that you are a failure or have let yourself or your family down ***  Trouble concentrating on things, such as reading the newspaper or watching television ***  Moving or speaking so slowly that other people could have noticed? Or the opposite --- being so fidgety or restless that you have been moving around a lot more than usual ***  Thoughts that you would be better off dead or hurting yourself in some way ***  PHQ-9 Score ***    The Generalized Anxiety Disorder-7 (GAD-7) is a brief self-report measure that assesses symptoms of anxiety over the course of the last two weeks. [name] obtained a score of *** suggesting {gbgad7severity:21753}. [name] finds the endorsed symptoms to be {gbphq9difficulty:21754}. [0= Not at all; 1=  Several days; 2= Over half the days; 3= Nearly every day] Feeling nervous, anxious, on edge ***  Not being able to stop or control worrying ***  Worrying too much  about different things ***  Trouble relaxing ***  Being so restless that it's hard to sit still ***  Becoming easily annoyed or irritable ***  Feeling afraid as if something awful might happen ***  GAD-7 Score ***   Interventions:  {Interventions for Progress Notes:23405}  DSM-5 Diagnosis(es): 307.59 (F50.8) Other Specified Feeding or Eating Disorder, Emotional Eating Behaviors  Treatment Goal & Progress: During the initial appointment with this provider, the following treatment goal was established: increase coping skills. [name] has demonstrated progress in her goal as evidenced by {gbtxprogress:22839}. [name] also {gbtxprogress2:22951}.  Plan: The next appointment will be scheduled in {gbweeks:21758}, which will be {gbtxmodality:23402}. The next session will focus on {Plan for Next Appointment:23400}.

## 2020-04-08 ENCOUNTER — Other Ambulatory Visit (INDEPENDENT_AMBULATORY_CARE_PROVIDER_SITE_OTHER): Payer: Self-pay | Admitting: Family Medicine

## 2020-04-08 DIAGNOSIS — E559 Vitamin D deficiency, unspecified: Secondary | ICD-10-CM

## 2020-04-10 ENCOUNTER — Telehealth (INDEPENDENT_AMBULATORY_CARE_PROVIDER_SITE_OTHER): Payer: BC Managed Care – PPO | Admitting: Psychology

## 2020-04-10 ENCOUNTER — Telehealth (INDEPENDENT_AMBULATORY_CARE_PROVIDER_SITE_OTHER): Payer: Self-pay | Admitting: Psychology

## 2020-04-10 NOTE — Telephone Encounter (Signed)
  Office: [phone]  /  Fax: [phone]  Date of Call: April 10, 2020  Time of Call: 4:32pm Provider: Lawerance Cruel, PsyD  CONTENT: This provider called Dorene Grebe to check-in as she did not present for today's MyChart Video Visit appointment at 4:30pm. A HIPAA compliant voicemail was left requesting a call back. Of note, this provider stayed on the MyChart Video Visit appointment for 5 minutes prior to signing off per the clinic's grace period policy.    PLAN: This provider will wait for Ajanay to call back. No further follow-up planned by this provider.

## 2020-04-13 ENCOUNTER — Inpatient Hospital Stay (HOSPITAL_COMMUNITY)
Admission: EM | Admit: 2020-04-13 | Discharge: 2020-04-16 | DRG: 177 | Disposition: A | Discharge status: Home / Self Care | Payer: BC Managed Care – PPO | Attending: Internal Medicine | Admitting: Internal Medicine

## 2020-04-13 ENCOUNTER — Encounter (HOSPITAL_COMMUNITY): Payer: Self-pay | Admitting: Emergency Medicine

## 2020-04-13 ENCOUNTER — Emergency Department (HOSPITAL_COMMUNITY): Payer: BC Managed Care – PPO

## 2020-04-13 ENCOUNTER — Other Ambulatory Visit: Payer: Self-pay

## 2020-04-13 DIAGNOSIS — U071 COVID-19: Secondary | ICD-10-CM | POA: Diagnosis present

## 2020-04-13 DIAGNOSIS — Z79899 Other long term (current) drug therapy: Secondary | ICD-10-CM

## 2020-04-13 DIAGNOSIS — Z6841 Body Mass Index (BMI) 40.0 and over, adult: Secondary | ICD-10-CM

## 2020-04-13 DIAGNOSIS — Z7952 Long term (current) use of systemic steroids: Secondary | ICD-10-CM

## 2020-04-13 DIAGNOSIS — F329 Major depressive disorder, single episode, unspecified: Secondary | ICD-10-CM | POA: Diagnosis present

## 2020-04-13 DIAGNOSIS — J9601 Acute respiratory failure with hypoxia: Secondary | ICD-10-CM | POA: Diagnosis present

## 2020-04-13 DIAGNOSIS — Z20822 Contact with and (suspected) exposure to covid-19: Secondary | ICD-10-CM

## 2020-04-13 DIAGNOSIS — J1282 Pneumonia due to coronavirus disease 2019: Secondary | ICD-10-CM | POA: Diagnosis present

## 2020-04-13 DIAGNOSIS — E119 Type 2 diabetes mellitus without complications: Secondary | ICD-10-CM | POA: Diagnosis present

## 2020-04-13 DIAGNOSIS — J189 Pneumonia, unspecified organism: Secondary | ICD-10-CM | POA: Diagnosis not present

## 2020-04-13 DIAGNOSIS — E0500 Thyrotoxicosis with diffuse goiter without thyrotoxic crisis or storm: Secondary | ICD-10-CM | POA: Diagnosis present

## 2020-04-13 DIAGNOSIS — F419 Anxiety disorder, unspecified: Secondary | ICD-10-CM | POA: Diagnosis present

## 2020-04-13 HISTORY — DX: COVID-19: U07.1

## 2020-04-13 HISTORY — DX: Acute respiratory failure with hypoxia: J96.01

## 2020-04-13 LAB — BASIC METABOLIC PANEL
Anion gap: 11 (ref 5–15)
BUN: 11 mg/dL (ref 6–20)
CO2: 26 mmol/L (ref 22–32)
Calcium: 8.3 mg/dL — ABNORMAL LOW (ref 8.9–10.3)
Chloride: 103 mmol/L (ref 98–111)
Creatinine, Ser: 0.96 mg/dL (ref 0.44–1.00)
GFR calc Af Amer: >60 mL/min (ref >60)
GFR calc non Af Amer: >60 mL/min (ref >60)
Glucose, Bld: 118 mg/dL — ABNORMAL HIGH (ref 70–99)
Potassium: 3.5 mmol/L (ref 3.5–5.1)
Sodium: 140 mmol/L (ref 135–145)

## 2020-04-13 LAB — CBC
HCT: 37.4 % (ref 36.0–46.0)
Hemoglobin: 12.6 g/dL (ref 12.0–15.0)
MCH: 30.5 pg (ref 26.0–34.0)
MCHC: 33.7 g/dL (ref 30.0–36.0)
MCV: 90.6 fL (ref 80.0–100.0)
Platelets: 181 10*3/uL (ref 150–400)
RBC: 4.13 MIL/uL (ref 3.87–5.11)
RDW: 13.9 % (ref 11.5–15.5)
WBC: 9.8 10*3/uL (ref 4.0–10.5)
nRBC: 0 % (ref 0.0–0.2)

## 2020-04-13 LAB — COMPREHENSIVE METABOLIC PANEL
ALT: 44 U/L (ref 0–44)
AST: 34 U/L (ref 15–41)
Albumin: 3.1 g/dL — ABNORMAL LOW (ref 3.5–5.0)
Alkaline Phosphatase: 48 U/L (ref 38–126)
Anion gap: 14 (ref 5–15)
BUN: 8 mg/dL (ref 6–20)
CO2: 23 mmol/L (ref 22–32)
Calcium: 8.2 mg/dL — ABNORMAL LOW (ref 8.9–10.3)
Chloride: 103 mmol/L (ref 98–111)
Creatinine, Ser: 0.87 mg/dL (ref 0.44–1.00)
GFR calc Af Amer: >60 mL/min (ref >60)
GFR calc non Af Amer: >60 mL/min (ref >60)
Glucose, Bld: 201 mg/dL — ABNORMAL HIGH (ref 70–99)
Potassium: 3.3 mmol/L — ABNORMAL LOW (ref 3.5–5.1)
Sodium: 140 mmol/L (ref 135–145)
Total Bilirubin: 1.4 mg/dL — ABNORMAL HIGH (ref 0.3–1.2)
Total Protein: 6.5 g/dL (ref 6.5–8.1)

## 2020-04-13 LAB — FERRITIN: Ferritin: 332 ng/mL — ABNORMAL HIGH (ref 11–307)

## 2020-04-13 LAB — FIBRINOGEN: Fibrinogen: 553 mg/dL — ABNORMAL HIGH (ref 210–475)

## 2020-04-13 LAB — LACTIC ACID, PLASMA
Lactic Acid, Venous: 1.7 mmol/L (ref 0.5–1.9)
Lactic Acid, Venous: 2.7 mmol/L (ref 0.5–1.9)

## 2020-04-13 LAB — PROCALCITONIN: Procalcitonin: <0.1 ng/mL

## 2020-04-13 LAB — SARS CORONAVIRUS 2 BY RT PCR (HOSPITAL ORDER, PERFORMED IN ~~LOC~~ HOSPITAL LAB): SARS Coronavirus 2: POSITIVE — AB

## 2020-04-13 LAB — TRIGLYCERIDES: Triglycerides: 105 mg/dL (ref <150)

## 2020-04-13 LAB — LACTATE DEHYDROGENASE: LDH: 339 U/L — ABNORMAL HIGH (ref 98–192)

## 2020-04-13 LAB — I-STAT BETA HCG BLOOD, ED (MC, WL, AP ONLY): I-stat hCG, quantitative: <5 m[IU]/mL (ref <5)

## 2020-04-13 LAB — D-DIMER, QUANTITATIVE: D-Dimer, Quant: 5.26 ug/mL-FEU — ABNORMAL HIGH (ref 0.00–0.50)

## 2020-04-13 LAB — HIV ANTIBODY (ROUTINE TESTING W REFLEX): HIV Screen 4th Generation wRfx: NONREACTIVE

## 2020-04-13 LAB — C-REACTIVE PROTEIN: CRP: 6.2 mg/dL — ABNORMAL HIGH (ref <1.0)

## 2020-04-13 MED ORDER — ACETAMINOPHEN 325 MG PO TABS: 650.0000 mg | ORAL_TABLET | Freq: Four times a day (QID) | ORAL | Status: DC | PRN

## 2020-04-13 MED ORDER — ONDANSETRON HCL 4 MG/2ML IJ SOLN: 4.0000 mg | Freq: Four times a day (QID) | INTRAMUSCULAR | Status: DC | PRN

## 2020-04-13 MED ORDER — DEXAMETHASONE SODIUM PHOSPHATE 10 MG/ML IJ SOLN
10.0000 mg | Freq: Once | INTRAMUSCULAR | Status: AC
  Administered 2020-04-13: 10 mg via INTRAVENOUS
  Filled 2020-04-13: qty 1

## 2020-04-13 MED ORDER — ENOXAPARIN SODIUM 40 MG/0.4ML ~~LOC~~ SOLN
40.0000 mg | SUBCUTANEOUS | Status: DC
  Administered 2020-04-13 – 2020-04-14 (×2): 40 mg via SUBCUTANEOUS
  Filled 2020-04-13 (×2): qty 0.4

## 2020-04-13 MED ORDER — ONDANSETRON HCL 4 MG PO TABS: 4.0000 mg | ORAL_TABLET | Freq: Four times a day (QID) | ORAL | Status: DC | PRN

## 2020-04-13 MED ORDER — DEXAMETHASONE 4 MG PO TABS
6.0000 mg | ORAL_TABLET | ORAL | Status: DC
  Administered 2020-04-14 – 2020-04-16 (×3): 6 mg via ORAL
  Filled 2020-04-13 (×3): qty 2

## 2020-04-13 MED ORDER — SODIUM CHLORIDE 0.9 % IV SOLN
100.0000 mg | Freq: Every day | INTRAVENOUS | Status: DC
  Administered 2020-04-14 – 2020-04-16 (×3): 100 mg via INTRAVENOUS
  Filled 2020-04-13 (×3): qty 20

## 2020-04-13 MED ORDER — SODIUM CHLORIDE 0.9 % IV SOLN
200.0000 mg | Freq: Once | INTRAVENOUS | Status: AC
  Administered 2020-04-13: 200 mg via INTRAVENOUS
  Filled 2020-04-13: qty 40

## 2020-04-13 MED ORDER — LACTATED RINGERS IV BOLUS
1000.0000 mL | Freq: Once | INTRAVENOUS | Status: AC
  Administered 2020-04-13: 1000 mL via INTRAVENOUS

## 2020-04-13 MED ORDER — HYDROCOD POLST-CPM POLST ER 10-8 MG/5ML PO SUER: 5.0000 mL | Freq: Two times a day (BID) | ORAL | Status: DC | PRN

## 2020-04-13 MED ORDER — GUAIFENESIN-DM 100-10 MG/5ML PO SYRP: 10.0000 mL | ORAL_SOLUTION | ORAL | Status: DC | PRN

## 2020-04-13 NOTE — ED Notes (Signed)
PT placed on 2 liters O2 for Sats 90%.

## 2020-04-13 NOTE — ED Provider Notes (Addendum)
[facility] EM[facility]GENCY [facility] Provider Note   CSN: [id number] Arrival date & time: 04/13/20  [id number]     History Chief Complaint  Patient presents with  . Covid Positive  . Shortness of Breath    Brittney Brownsatalie R Hiebert is a 39 y.o. female with a past medical history of anxiety, depression, prediabetes obesity, Graves' disease who presents the emergency department with chief complaint of shortness of breath.  She states that she has been symptomatic with headaches, body aches, chills since Friday, 29 March 2020.  Her child tested positive for Covid.  This week she began feeling extremely short of breath and had test done that showed that she was positive.  She has had associated fevers, chills, body aches, nausea, vomiting and diarrhea with poor appetite.  Patient arrived to triage with oxygen saturations at 90% on room air.  She complains of severe coughing, difficulty lying flat because that makes her cough heavily. HPI     Past Medical History:  Diagnosis Date  . Anxiety   . COVID-19   . Depression   . Depression   . Diabetes mellitus without complication (HCC)    gestational  . Family history of adverse reaction to anesthesia    mother has nausea post surgery  . Fatigue   . Graves disease    no longer need medication treatment  . Graves disease   . HPV in female   . Joint pain   . Obesity   . Palpitations   . Prediabetes   . Swelling     Patient Active Problem List   Diagnosis Date Noted  . Menorrhagia with regular cycle 07/22/2016  . Gestational diabetes 04/14/2013    Past Surgical History:  Procedure Laterality Date  . COLPOSCOPY    . DILITATION & CURRETTAGE/HYSTROSCOPY WITH HYDROTHERMAL ABLATION N/A 07/22/2016   Procedure: DILATATION & CURETTAGE/HYSTEROSCOPY WITH HYDROTHERMAL ABLATION;  Surgeon: Gerald Leitzara Cole, MD;  Location: [facility];  Service: Gynecology;  Laterality: N/A;  . LAPAROSCOPIC TUBAL LIGATION Bilateral 07/22/2016   Procedure: LAPAROSCOPIC  TUBAL LIGATION;  Surgeon: Gerald Leitzara Cole, MD;  Location: [facility];  Service: Gynecology;  Laterality: Bilateral;  . TONSILLECTOMY       OB History    Gravida  3   Para  3   Term  3   Preterm      AB      Living  3     SAB      TAB      Ectopic      Multiple      Live Births  3           Family History  Problem Relation Age of Onset  . Hypertension Other   . Hypertension Mother   . Thyroid disease Mother   . High Cholesterol Mother   . Obesity Mother   . Heart disease Maternal Grandfather   . High blood pressure Father   . High Cholesterol Father   . Obesity Father     Social History   Tobacco Use  . Smoking status: Never Smoker  . Smokeless tobacco: Never Used  Vaping Use  . Vaping Use: Never used  Substance Use Topics  . Alcohol use: Yes    Comment: occ  . Drug use: No    Home Medications Prior to Admission medications   Medication Sig Start Date End Date Taking? Authorizing Provider  Calcium Carbonate (CALCIUM 500 PO) Take 1 capsule by mouth daily.    [provider]  cholecalciferol (VITAMIN D3) 25 MCG (1000 UNIT) tablet Take 1,000 Units by mouth daily.    [provider]  KRILL OIL PO Take 1 capsule by mouth daily.    [provider]  Nutritional Supplements (JUICE PLUS FIBRE PO) Take 12 each by mouth every morning. Take 12 gummies by mouth daily.    [provider]  OVER THE COUNTER MEDICATION Plexus Biocleanse 2 caps daily    [provider]  OVER THE COUNTER MEDICATION Vita Biome 1 cap daily    [provider]  OVER THE COUNTER MEDICATION Probio 5 2 caps daily    [provider]  UNABLE TO FIND Med Name: 5HTP 1 cap DAILY (ANXIETY/MOOD)    [provider]  Vitamin D, Ergocalciferol, (DRISDOL) 1.25 MG (50000 UNIT) CAPS capsule Take 1 capsule (50,000 Units total) by mouth every 7 (seven) days. 03/04/20   Helane Rima, DO    Allergies    Patient has no known  allergies.  Review of Systems   Review of Systems Ten systems reviewed and are negative for acute change, except as noted in the HPI.   Physical Exam Updated Vital Signs BP 111/67 (BP Location: Right Arm)   Pulse 77   Temp 99.8 F (37.7 C) (Oral)   Resp 20   SpO2 95%   Physical Exam Vitals and nursing note reviewed.  Constitutional:      General: She is not in acute distress.    Appearance: She is well-developed. She is not diaphoretic.  HENT:     Head: Normocephalic and atraumatic.  Eyes:     General: No scleral icterus.    Conjunctiva/sclera: Conjunctivae normal.  Cardiovascular:     Rate and Rhythm: Normal rate and regular rhythm.     Heart sounds: Normal heart sounds. No murmur heard.  No friction rub. No gallop.   Pulmonary:     Effort: Pulmonary effort is normal. Tachypnea present. No respiratory distress.     Breath sounds: Examination of the right-middle field reveals rales. Examination of the left-middle field reveals rales. Examination of the right-lower field reveals rales. Examination of the left-lower field reveals rales. Rales present.  Abdominal:     General: Bowel sounds are normal. There is no distension.     Palpations: Abdomen is soft. There is no mass.     Tenderness: There is no abdominal tenderness. There is no guarding.  Musculoskeletal:     Cervical back: Normal range of motion.  Skin:    General: Skin is warm and dry.  Neurological:     Mental Status: She is alert and oriented to person, place, and time.  Psychiatric:        Behavior: Behavior normal.     ED Results / Procedures / Treatments   Labs (all labs ordered are listed, but only abnormal results are displayed) Labs Reviewed  BASIC METABOLIC PANEL - Abnormal; Notable for the following components:      Result Value   Glucose, Bld 118 (*)    Calcium 8.3 (*)    All other components within normal limits  CBC  I-STAT BETA HCG BLOOD, ED ([facility], [facility], [facility] ONLY)     EKG None  Radiology DG Chest Portable 1 View  Result Date: 04/13/2020 CLINICAL DATA:  Shortness of breath.  COVID-19 positive. EXAM: PORTABLE CHEST 1 VIEW COMPARISON:  None. FINDINGS: Lungs are adequately inflated demonstrate moderate patchy bilateral airspace process probably over the mid to lower lungs. No effusion or pneumothorax.  Cardiomediastinal silhouette and remainder the exam is unremarkable. IMPRESSION: Moderate patchy bilateral airspace process likely multifocal pneumonia. Electronically Signed   By: Elberta Fortis M.D.   On: 04/13/2020 09:49    Procedures .Critical Care Performed by: Arthor Captain, PA-C Authorized by: Arthor Captain, PA-C   Critical care provider statement:    Critical care time (minutes):  45   Critical care time was exclusive of:  Separately billable procedures and treating other patients   Critical care was necessary to treat or prevent imminent or life-threatening deterioration of the following conditions:  Respiratory failure   Critical care was time spent personally by me on the following activities:  Discussions with consultants, evaluation of patient's response to treatment, examination of patient, ordering and performing treatments and interventions, ordering and review of laboratory studies, ordering and review of radiographic studies, pulse oximetry, re-evaluation of patient's condition, obtaining history from patient or surrogate and review of old charts   (including critical care time)  Medications Ordered in ED Medications - No data to display  ED Course  I have reviewed the triage vital signs and the nursing notes.  Pertinent labs & imaging results that were available during my care of the patient were reviewed by me and considered in my medical decision making (see chart for details).  Clinical Course as of Apr 14 1207  Sat Apr 13, 2020  1132 SpO2: 97 % [AH]    Clinical Course User Index [AH] Arthor Captain, PA-C   MDM  Rules/Calculators/A&P                          12:49 PM BP 111/67 (BP Location: Right Arm)   Pulse 77   Temp 99.8 F (37.7 C) (Oral)   Resp 20   SpO2 95%  CC:sob, covid + VS: BP 128/77   Pulse 82   Temp 99.8 F (37.7 C) (Oral)   Resp 16   SpO2 96%  ZO:XWRUEAV is gathered by patient  and EMR. Previous records obtained and reviewed. DDX:The patient's complaint of my brother and poor department may not involves an extensive number of diagnostic and treatment options, and is a complaint that carries with it a high risk of complications, morbidity, and potential mortality. Given the large differential diagnosis, medical decision making is of high complexity. The emergent differential diagnosis for shortness of breath includes, but is not limited to, Pulmonary edema, bronchoconstriction, Pneumonia, Pulmonary embolism, Pneumotherax/ Hemothorax, Dysrythmia, ACS.  Labs: I ordered reviewed and interpreted labs which include BMP, CBC, i-STAT hCG.  Only mildly elevated blood glucose of insignificant value is noted.  Covid test is pending. Imaging: I ordered and reviewed images which included 1 view chest x-ray. I independently visualized and interpreted all imaging. Significant findings include multifocal pneumonia.  EKG: Sinus rhythm at a rate of 96 with nonspecific T wave abnormalities and short PR interval. Consults: Spoke with the internal medicine residency service who will admit the patient for acute hypoxic respiratory failure in the setting of COVID-19. MDM: Patient here with complaint of shortness of breath.  She is Covid positive and hypoxic down to 83% with ex mid minimal exertion by the bedside.  She is requiring 2 L to maintain oxygen saturations up to 88% with exertion.  She is much better at rest.  I have very low suspicion for other cause of her symptoms such as pulmonary embolus.  No evidence of effusion or pneumothorax.  Patient given Decadron and remdesivir here in  the emergency  department.  She will be admitted to the hospital. Patient disposition: Admit The patient appears reasonably stabilized for admission considering the current resources, flow, and capabilities available in the ED at this time, and I doubt any other [facility] requiring further screening and/or treatment in the ED prior to admission.    Brittney Lester was evaluated in Emergency Department on 04/13/2020 for the symptoms described in the history of present illness. She was evaluated in the context of the global COVID-19 pandemic, which necessitated consideration that the patient might be at risk for infection with the SARS-CoV-2 virus that causes COVID-19. Institutional protocols and algorithms that pertain to the evaluation of patients at risk for COVID-19 are in a state of rapid change based on information released by regulatory bodies including the CDC and federal and state organizations. These policies and algorithms were followed during the patient's care in the ED.     12:45 PM BP 111/67 (BP Location: Right Arm)   Pulse 77   Temp 99.8 F (37.7 C) (Oral)   Resp 20   SpO2 95%  AT BEDSIDE- PATIENT DESATURATED TO 83% STANDING AND ROCKING SIDE TO SIDE. PATIENT REQUIRED 2 L TO BRING HER EXERTIONAL OXYGEN UP TO 88/89%  Final Clinical Impression(s) / ED Diagnoses Final diagnoses:  Acute respiratory failure with hypoxia (HCC)  Multifocal pneumonia  Suspected COVID-19 virus infection    Rx / DC Orders ED Discharge Orders    None       Arthor Captain, PA-C 04/13/20 1401    Arthor Captain, PA-C 04/13/20 1402    Margarita Grizzle, MD 04/13/20 2106

## 2020-04-13 NOTE — Progress Notes (Addendum)
CRITICAL VALUE STICKER  CRITICAL VALUE: Lactic 2.7  RECEIVER : Marwin Primmer RN  DATE & TIME NOTIFIED: 04/13/20 2030  MESSENGER : LAB  MD NOTIFIED: Monica Becton MD (Sent page to [facility]  TIME OF NOTIFICATION:2035  RESPONSE: Pending

## 2020-04-13 NOTE — ED Triage Notes (Signed)
Pt reports COVID symptoms since 9/3.  Tested + for COVID on Monday.  C/o SOB, fever, and productive cough with orange phlegm.

## 2020-04-13 NOTE — H&P (Signed)
Date: 04/13/2020               Patient Name:  Brittney Lester MRN: [id number]  DOB: 07-Jan-1981 Age / Sex: 39 y.o., female   PCP: Gerald Leitz, MD         Medical Service: Internal Medicine Teaching Service         Attending Physician: Dr. Oswaldo Done, Marquita Palms, *    First Contact: Dr. Karilyn Cota Pager: [phone]  Second Contact: Dr. Oswaldo Done Pager: [phone]       After Hours (After 5p/  First Contact Pager: [phone]  weekends / holidays): Second Contact Pager: [phone]   Chief Complaint: Shortness of breath  History of Present Illness: Brittney Lester is a 39 year old obese female with a past medical history of depression, anxiety, Graves' disease no longer on medication and gestational diabetes presenting with worsening shortness of breath after testing positive for COVID-19 on 04/08/2020.  Reports her son was found to be positive on 03/27/2020 and she has been symptomatic since 9/3 with fevers, headaches, lightheadedness, shortness of breath and diarrhea.  She initially lost se sense of taste and smell however has recovered from this.  She reports her breathing worsened on 9/7.  Symptoms progressed and she was seen at an outpatient facility on Monday when she tested positive and was prescribed prednisone and inhalers.  She also states being treated with 3 doses of ivermectin.  She states her breathing continued to worsen which prompted her to report to the ED.  She has been sleeping in a chair due to difficulty breathing while flat.  Patient is not vaccinated for COVID-19.  On arrival to the emergency department she was saturating at 90% on room air.  She was placed on 2 L nasal cannula saturating at 97%.  On ambulation she desaturated to 82%.  Chest x-ray showed moderate patchy bilateral airspace disease consistent with multifocal pneumonia.  She was started on Decadron and remdesivir.    Meds:  Patient states she only takes over-the-counter vitamins.  No current facility-administered  medications on file prior to encounter.   Current Outpatient Medications on File Prior to Encounter  Medication Sig Dispense Refill  . acetaminophen (TYLENOL) 500 MG tablet Take 1,000 mg by mouth every 6 (six) hours as needed for headache (pain).    Marland Kitchen albuterol (VENTOLIN HFA) 108 (90 Base) MCG/ACT inhaler Inhale 1 puff into the lungs every 4 (four) hours as needed for wheezing or shortness of breath.     . Ascorbic Acid (VITAMIN C PO) Take 1 tablet by mouth at bedtime.    Marland Kitchen OVER THE COUNTER MEDICATION Take 4 capsules by mouth at bedtime. 2 fruit/ 2 vegie    . predniSONE (DELTASONE) 20 MG tablet Take 40 mg by mouth daily.    . Probiotic Product (PROBIOTIC PO) Take 1 capsule by mouth at bedtime. Vitalbiome    . Probiotic Product (PROBIOTIC PO) Take 1 capsule by mouth at bedtime. Probio 5 2    . Vitamin D, Ergocalciferol, (DRISDOL) 1.25 MG (50000 UNIT) CAPS capsule Take 1 capsule (50,000 Units total) by mouth every 7 (seven) days. (Patient taking differently: Take 50,000 Units by mouth every Sunday. ) 4 capsule 0  . VITAMIN D-VITAMIN K PO Take 1 tablet by mouth at bedtime.    . Zinc 50 MG TABS Take 50 mg by mouth at bedtime.    Marland Kitchen 5-HTP CAPS Take 1 capsule by mouth daily. For anxiety/mood    . CALCIUM PO Take 1  tablet by mouth daily.    Marland Kitchen KRILL OIL PO Take 1 capsule by mouth daily.    Marland Kitchen OVER THE COUNTER MEDICATION Take 2 capsules by mouth daily. Plexus Biocleanse     Current Meds  Medication Sig  . acetaminophen (TYLENOL) 500 MG tablet Take 1,000 mg by mouth every 6 (six) hours as needed for headache (pain).  Marland Kitchen albuterol (VENTOLIN HFA) 108 (90 Base) MCG/ACT inhaler Inhale 1 puff into the lungs every 4 (four) hours as needed for wheezing or shortness of breath.   . Ascorbic Acid (VITAMIN C PO) Take 1 tablet by mouth at bedtime.  Marland Kitchen OVER THE COUNTER MEDICATION Take 4 capsules by mouth at bedtime. 2 fruit/ 2 vegie  . predniSONE (DELTASONE) 20 MG tablet Take 40 mg by mouth daily.  . Probiotic  Product (PROBIOTIC PO) Take 1 capsule by mouth at bedtime. Vitalbiome  . Probiotic Product (PROBIOTIC PO) Take 1 capsule by mouth at bedtime. Probio 5 2  . Vitamin D, Ergocalciferol, (DRISDOL) 1.25 MG (50000 UNIT) CAPS capsule Take 1 capsule (50,000 Units total) by mouth every 7 (seven) days. (Patient taking differently: Take 50,000 Units by mouth every Sunday. )  . VITAMIN D-VITAMIN K PO Take 1 tablet by mouth at bedtime.  . Zinc 50 MG TABS Take 50 mg by mouth at bedtime.     Allergies: Allergies as of 04/13/2020  . (No Known Allergies)   Past Medical History:  Diagnosis Date  . Anxiety   . COVID-19   . Depression   . Depression   . Diabetes mellitus without complication (HCC)    gestational  . Family history of adverse reaction to anesthesia    mother has nausea post surgery  . Fatigue   . Graves disease    no longer need medication treatment  . Graves disease   . HPV in female   . Joint pain   . Obesity   . Palpitations   . Prediabetes   . Swelling     Family History:   Mother-hypertension, thyroid disease Maternal grandfather- heart disease  Social History: Denies any tobacco, alcohol or illicit drug use.  Patient works as a Doctor, general practice.  Review of Systems: A complete ROS was negative except as per HPI.   Physical Exam: Blood pressure 121/83, pulse 93, temperature 99.8 F (37.7 C), temperature source Oral, resp. rate 18, SpO2 93 %, unknown if currently breastfeeding.  Physical Exam Constitutional:      General: She is not in acute distress.    Appearance: She is obese. She is not ill-appearing or diaphoretic.  HENT:     Mouth/Throat:     Mouth: Mucous membranes are moist.     Pharynx: Oropharynx is clear. No pharyngeal swelling.  Eyes:     Extraocular Movements: Extraocular movements intact.     Pupils: Pupils are equal, round, and reactive to light.  Cardiovascular:     Rate and Rhythm: Normal rate and regular rhythm.     Pulses: Normal pulses.      Heart sounds: No murmur heard.  No friction rub. No gallop.   Pulmonary:     Effort: Pulmonary effort is normal. No tachypnea or respiratory distress.     Breath sounds: Normal breath sounds. No stridor. No wheezing, rhonchi or rales.  Abdominal:     General: Bowel sounds are normal.     Palpations: Abdomen is soft.     Tenderness: There is no abdominal tenderness. There is no rebound.  Musculoskeletal:  Right lower leg: No tenderness.     Comments: Nonpitting edema of bilateral LE  Skin:    General: Skin is warm and dry.  Neurological:     General: No focal deficit present.     Mental Status: She is alert and oriented to person, place, and time.  Psychiatric:        Mood and Affect: Mood normal.        Behavior: Behavior normal.    EKG: personally reviewed my interpretation is sinus rhythm.  CXR: personally reviewed my interpretation is bilateral patchy airspace disease, predominantly peripheral left greater than right.  Assessment & Plan by Problem: Principal Problem:   COVID-19 Active Problems:   Acute hypoxemic respiratory failure (HCC)   COVID-19 virus infection  39 year old female with a past medical history of obesity, depression, anxiety Graves' disease no longer requiring treatment and gestational diabetes presenting with acute hypoxic respiratory failure secondary to Covid-19.  Initially tested positive on 9/13.  Patient has been symptomatic since 9/3 after her child tested positive.  Treated with outpatient therapy of ivermectin, steroids and inhalers to which she was nonresponsive.  Acute hypoxic respiratory failure COVID-19 Hospital day 1.  Patient saturating at 96% on 2 L nasal cannula.  Vitals otherwise stable.  Inflammatory markers are pending; will trend.  Patient started on Decadron and remdesivir.  Discussed pulmonary rehab.  At this time patient does not qualify for baricitinib however if patient's CRP is significantly elevated and/or oxygen  requirements worsen we will consider this addition.  - O2 saturation goal > 92% - tussionex q12h prn, robitussen q4h prn - remdesivir day 1/5 - decadron day 1 - IS, flutter valve   VTE prophylaxis: Lovenox Diet: Regular IV fluids: None CODE STATUS: Full  Dispo: Admit patient to Inpatient with expected length of stay greater than 2 midnights.  SignedJaci Standard, DO 04/13/2020, 2:52 PM  Pager: [phone] After 5pm on weekdays and 1pm on weekends: On Call pager: [phone]    .arc

## 2020-04-14 ENCOUNTER — Encounter (INDEPENDENT_AMBULATORY_CARE_PROVIDER_SITE_OTHER): Payer: Self-pay | Admitting: Family Medicine

## 2020-04-14 LAB — CBC WITH DIFFERENTIAL/PLATELET
Abs Immature Granulocytes: 0.26 10*3/uL — ABNORMAL HIGH (ref 0.00–0.07)
Basophils Absolute: 0 10*3/uL (ref 0.0–0.1)
Basophils Relative: 0 %
Eosinophils Absolute: 0 10*3/uL (ref 0.0–0.5)
Eosinophils Relative: 0 %
HCT: 37.1 % (ref 36.0–46.0)
Hemoglobin: 12.4 g/dL (ref 12.0–15.0)
Immature Granulocytes: 2 %
Lymphocytes Relative: 10 %
Lymphs Abs: 1 10*3/uL (ref 0.7–4.0)
MCH: 30.1 pg (ref 26.0–34.0)
MCHC: 33.4 g/dL (ref 30.0–36.0)
MCV: 90 fL (ref 80.0–100.0)
Monocytes Absolute: 0.9 10*3/uL (ref 0.1–1.0)
Monocytes Relative: 8 %
Neutro Abs: 8.6 10*3/uL — ABNORMAL HIGH (ref 1.7–7.7)
Neutrophils Relative %: 80 %
Platelets: 213 10*3/uL (ref 150–400)
RBC: 4.12 MIL/uL (ref 3.87–5.11)
RDW: 13.2 % (ref 11.5–15.5)
WBC: 10.8 10*3/uL — ABNORMAL HIGH (ref 4.0–10.5)
nRBC: 0 % (ref 0.0–0.2)

## 2020-04-14 LAB — COMPREHENSIVE METABOLIC PANEL
ALT: 45 U/L — ABNORMAL HIGH (ref 0–44)
AST: 31 U/L (ref 15–41)
Albumin: 2.9 g/dL — ABNORMAL LOW (ref 3.5–5.0)
Alkaline Phosphatase: 50 U/L (ref 38–126)
Anion gap: 8 (ref 5–15)
BUN: 12 mg/dL (ref 6–20)
CO2: 29 mmol/L (ref 22–32)
Calcium: 8.2 mg/dL — ABNORMAL LOW (ref 8.9–10.3)
Chloride: 105 mmol/L (ref 98–111)
Creatinine, Ser: 1.04 mg/dL — ABNORMAL HIGH (ref 0.44–1.00)
GFR calc Af Amer: >60 mL/min (ref >60)
GFR calc non Af Amer: >60 mL/min (ref >60)
Glucose, Bld: 124 mg/dL — ABNORMAL HIGH (ref 70–99)
Potassium: 3.5 mmol/L (ref 3.5–5.1)
Sodium: 142 mmol/L (ref 135–145)
Total Bilirubin: 1.2 mg/dL (ref 0.3–1.2)
Total Protein: 6.2 g/dL — ABNORMAL LOW (ref 6.5–8.1)

## 2020-04-14 LAB — C-REACTIVE PROTEIN: CRP: 4.9 mg/dL — ABNORMAL HIGH (ref <1.0)

## 2020-04-14 LAB — MAGNESIUM: Magnesium: 2.2 mg/dL (ref 1.7–2.4)

## 2020-04-14 LAB — LACTIC ACID, PLASMA: Lactic Acid, Venous: 1.4 mmol/L (ref 0.5–1.9)

## 2020-04-14 LAB — PHOSPHORUS: Phosphorus: 2.4 mg/dL — ABNORMAL LOW (ref 2.5–4.6)

## 2020-04-14 LAB — D-DIMER, QUANTITATIVE: D-Dimer, Quant: 5.61 ug/mL-FEU — ABNORMAL HIGH (ref 0.00–0.50)

## 2020-04-14 LAB — FERRITIN: Ferritin: 305 ng/mL (ref 11–307)

## 2020-04-14 NOTE — Progress Notes (Signed)
   Subjective: Patient reports feeling well this morning.  States she feels her breathing is about the same.  Denies any chest pain, abdominal pain or diarrhea.  Objective:  Vital signs in last 24 hours: Vitals:   04/13/20 1701 04/13/20 1703 04/13/20 2341 04/14/20 0728  BP: 120/78  128/70 102/71  Pulse: 80  76 78  Resp: 20  (!) 25 19  Temp: 99.1 F (37.3 C)  99.3 F (37.4 C) 98.5 F (36.9 C)  TempSrc: Oral  Oral Oral  SpO2: 95%  94% 94%  Weight:  135.6 kg    Height:  5\' 4"  (1.626 m)      Supplemental Oxygen: 94% on 2L HFNC  Constitution: NAD, appears stated age Cardio: RRR, no m/r/g, no LE edema  Respiratory: CTA, no w/r/r Abdominal: NTTP, soft, non-distended MSK: moving all extremities Neuro: normal affect, a&ox3 Skin: c/d/i   Assessment/Plan:  Principal Problem:   COVID-19 Active Problems:   Acute hypoxemic respiratory failure (HCC)   COVID-19 virus infection  39 year old female with a past medical history of obesity, depression, anxiety Graves' disease no longer requiring treatment and gestational diabetes presenting with acute hypoxic respiratory failure secondary to Covid-19.  Initially tested positive on 9/13.  Patient has been symptomatic since 9/3 after her child tested positive.  Treated with outpatient therapy of ivermectin, steroids and inhalers to which she was nonresponsive.  Acute hypoxic respiratory failure COVID-19 Hospital day 2.  Saturating well on 2 L high flow nasal cannula.  Inflammatory markers are pending.  Lactic acid elevated yesterday, given 1 L bolus LR; repeat lactic acid pending.  Continue remdesivir and Decadron, day 2.  Encouraged pulmonary rehab.  - O2 saturation goal > 92% - tussionex q12h prn, robitussen q4h prn - remdesivir day 2/5 - decadron day 2 - follow-up lactic acid and continue IV fluids if it remains elevated - IS, flutter valve   VTE prophylaxis: Lovenox Diet: Regular IV fluids: None CODE STATUS: Full  Dispo:  Anticipated discharge pending clinical improvement.   Teresia Myint N, DO 04/14/2020, 7:57 AM Pager: [phone] After 5pm on weekdays and 1pm on weekends: On Call Pager: [phone]

## 2020-04-15 DIAGNOSIS — J9601 Acute respiratory failure with hypoxia: Secondary | ICD-10-CM

## 2020-04-15 DIAGNOSIS — J189 Pneumonia, unspecified organism: Secondary | ICD-10-CM

## 2020-04-15 DIAGNOSIS — J188 Other pneumonia, unspecified organism: Secondary | ICD-10-CM

## 2020-04-15 LAB — CBC WITH DIFFERENTIAL/PLATELET
Abs Immature Granulocytes: 0.6 10*3/uL — ABNORMAL HIGH (ref 0.00–0.07)
Basophils Absolute: 0 10*3/uL (ref 0.0–0.1)
Basophils Relative: 0 %
Eosinophils Absolute: 0 10*3/uL (ref 0.0–0.5)
Eosinophils Relative: 0 %
HCT: 40 % (ref 36.0–46.0)
Hemoglobin: 13.1 g/dL (ref 12.0–15.0)
Immature Granulocytes: 6 %
Lymphocytes Relative: 9 %
Lymphs Abs: 1 10*3/uL (ref 0.7–4.0)
MCH: 29.3 pg (ref 26.0–34.0)
MCHC: 32.8 g/dL (ref 30.0–36.0)
MCV: 89.5 fL (ref 80.0–100.0)
Monocytes Absolute: 0.7 10*3/uL (ref 0.1–1.0)
Monocytes Relative: 6 %
Neutro Abs: 8.5 10*3/uL — ABNORMAL HIGH (ref 1.7–7.7)
Neutrophils Relative %: 79 %
Platelets: 260 10*3/uL (ref 150–400)
RBC: 4.47 MIL/uL (ref 3.87–5.11)
RDW: 13.3 % (ref 11.5–15.5)
WBC: 10.9 10*3/uL — ABNORMAL HIGH (ref 4.0–10.5)
nRBC: 0 % (ref 0.0–0.2)

## 2020-04-15 LAB — COMPREHENSIVE METABOLIC PANEL
ALT: 100 U/L — ABNORMAL HIGH (ref 0–44)
AST: 68 U/L — ABNORMAL HIGH (ref 15–41)
Albumin: 3.3 g/dL — ABNORMAL LOW (ref 3.5–5.0)
Alkaline Phosphatase: 52 U/L (ref 38–126)
Anion gap: 11 (ref 5–15)
BUN: 14 mg/dL (ref 6–20)
CO2: 27 mmol/L (ref 22–32)
Calcium: 8.5 mg/dL — ABNORMAL LOW (ref 8.9–10.3)
Chloride: 102 mmol/L (ref 98–111)
Creatinine, Ser: 0.94 mg/dL (ref 0.44–1.00)
GFR calc Af Amer: >60 mL/min (ref >60)
GFR calc non Af Amer: >60 mL/min (ref >60)
Glucose, Bld: 143 mg/dL — ABNORMAL HIGH (ref 70–99)
Potassium: 4 mmol/L (ref 3.5–5.1)
Sodium: 140 mmol/L (ref 135–145)
Total Bilirubin: 0.9 mg/dL (ref 0.3–1.2)
Total Protein: 6.8 g/dL (ref 6.5–8.1)

## 2020-04-15 LAB — D-DIMER, QUANTITATIVE: D-Dimer, Quant: 3.46 ug/mL-FEU — ABNORMAL HIGH (ref 0.00–0.50)

## 2020-04-15 LAB — C-REACTIVE PROTEIN: CRP: 1.9 mg/dL — ABNORMAL HIGH (ref <1.0)

## 2020-04-15 LAB — PHOSPHORUS: Phosphorus: 3.4 mg/dL (ref 2.5–4.6)

## 2020-04-15 LAB — MAGNESIUM: Magnesium: 2.2 mg/dL (ref 1.7–2.4)

## 2020-04-15 LAB — FERRITIN: Ferritin: 427 ng/mL — ABNORMAL HIGH (ref 11–307)

## 2020-04-15 MED ORDER — ALBUTEROL SULFATE HFA 108 (90 BASE) MCG/ACT IN AERS
1.0000 | INHALATION_SPRAY | RESPIRATORY_TRACT | Status: DC | PRN
  Filled 2020-04-15: qty 6.7

## 2020-04-15 MED ORDER — ENOXAPARIN SODIUM 80 MG/0.8ML ~~LOC~~ SOLN
68.0000 mg | SUBCUTANEOUS | Status: DC
  Administered 2020-04-15: 68 mg via SUBCUTANEOUS
  Filled 2020-04-15: qty 0.8

## 2020-04-15 MED ORDER — K PHOS MONO-SOD PHOS DI & MONO 155-852-130 MG PO TABS
500.0000 mg | ORAL_TABLET | Freq: Once | ORAL | Status: AC
  Administered 2020-04-15: 500 mg via ORAL
  Filled 2020-04-15: qty 2

## 2020-04-15 NOTE — Significant Event (Signed)
Room air at rest 96% Room air on exertion 94% 2L O2 via Nasal Cannula at rest 99% 2L O2 via Nasal Cannula on exertion 96%

## 2020-04-15 NOTE — Progress Notes (Signed)
   Subjective:   Feeling much more rested today, like she slept for the first time in two weeks. Breathing has improved, still has cough. No nausea or constipation. Has shortness of breath with ambulation. No dizziness.   Objective:  Vital signs in last 24 hours: Vitals:   04/14/20 0728 04/14/20 1651 04/14/20 1800 04/14/20 2056  BP: 102/71 114/74  111/87  Pulse: 78 80 60 61  Resp: 19 (!) 27 16 (!) 25  Temp: 98.5 F (36.9 C) 98.1 F (36.7 C)  98.5 F (36.9 C)  TempSrc: Oral Oral  Oral  SpO2: 94% 98% 97% 98%  Weight:      Height:        Supplemental Oxygen: 94% on 2L HFNC  Constitution: NAD, sitting up in chair Cardio: RRR, no m/r/g, no LE edema  Respiratory: CTA, no w/r/r MSK: moving all extremities Neuro: normal affect, a&ox3, pleasant Skin: c/d/i   Assessment/Plan:  Principal Problem:   COVID-19 Active Problems:   Acute hypoxemic respiratory failure (HCC)   COVID-19 virus infection  39 year old female with a past medical history of obesity, depression, anxiety Graves' disease no longer requiring treatment and gestational diabetes presenting with acute hypoxic respiratory failure secondary to Covid-19.  Initially tested positive on 9/13.  Patient has been symptomatic since 9/3 after her child tested positive.  Treated with outpatient therapy of ivermectin, steroids and inhalers to which she was nonresponsive.  Acute hypoxic respiratory failure COVID-19 Hospital day 3. On 2L, inflammatory markers stable today.   - ambulate with oxygen - O2 saturation goal > 92% - tussionex q12h prn, robitussen q4h prn - remdesivir day 3/5 - decadron day 3 - IS, flutter valve - trend inflammatory markers - consult to [facility] for PCP  VTE prophylaxis: Lovenox Diet: Regular IV fluids: None CODE STATUS: Full  Dispo: Anticipated discharge 1-2 days.   Seawell, Jaimie A, DO 04/15/2020, 1:06 PM Pager: [phone] After 5pm on weekdays and 1pm on weekends: On Call Pager: [phone]

## 2020-04-16 LAB — CBC WITH DIFFERENTIAL/PLATELET
Abs Immature Granulocytes: 0.55 10*3/uL — ABNORMAL HIGH (ref 0.00–0.07)
Basophils Absolute: 0.1 10*3/uL (ref 0.0–0.1)
Basophils Relative: 1 %
Eosinophils Absolute: 0 10*3/uL (ref 0.0–0.5)
Eosinophils Relative: 0 %
HCT: 36.5 % (ref 36.0–46.0)
Hemoglobin: 12.3 g/dL (ref 12.0–15.0)
Immature Granulocytes: 6 %
Lymphocytes Relative: 16 %
Lymphs Abs: 1.6 10*3/uL (ref 0.7–4.0)
MCH: 29.9 pg (ref 26.0–34.0)
MCHC: 33.7 g/dL (ref 30.0–36.0)
MCV: 88.8 fL (ref 80.0–100.0)
Monocytes Absolute: 0.9 10*3/uL (ref 0.1–1.0)
Monocytes Relative: 9 %
Neutro Abs: 6.6 10*3/uL (ref 1.7–7.7)
Neutrophils Relative %: 68 %
Platelets: 207 10*3/uL (ref 150–400)
RBC: 4.11 MIL/uL (ref 3.87–5.11)
RDW: 13.2 % (ref 11.5–15.5)
WBC: 9.6 10*3/uL (ref 4.0–10.5)
nRBC: 0.4 % — ABNORMAL HIGH (ref 0.0–0.2)

## 2020-04-16 LAB — D-DIMER, QUANTITATIVE: D-Dimer, Quant: 2.99 ug/mL-FEU — ABNORMAL HIGH (ref 0.00–0.50)

## 2020-04-16 LAB — COMPREHENSIVE METABOLIC PANEL
ALT: 91 U/L — ABNORMAL HIGH (ref 0–44)
AST: 58 U/L — ABNORMAL HIGH (ref 15–41)
Albumin: 2.9 g/dL — ABNORMAL LOW (ref 3.5–5.0)
Alkaline Phosphatase: 45 U/L (ref 38–126)
Anion gap: 10 (ref 5–15)
BUN: 17 mg/dL (ref 6–20)
CO2: 26 mmol/L (ref 22–32)
Calcium: 8 mg/dL — ABNORMAL LOW (ref 8.9–10.3)
Chloride: 103 mmol/L (ref 98–111)
Creatinine, Ser: 0.9 mg/dL (ref 0.44–1.00)
GFR calc Af Amer: >60 mL/min (ref >60)
GFR calc non Af Amer: >60 mL/min (ref >60)
Glucose, Bld: 93 mg/dL (ref 70–99)
Potassium: 3.7 mmol/L (ref 3.5–5.1)
Sodium: 139 mmol/L (ref 135–145)
Total Bilirubin: 1.3 mg/dL — ABNORMAL HIGH (ref 0.3–1.2)
Total Protein: 5.8 g/dL — ABNORMAL LOW (ref 6.5–8.1)

## 2020-04-16 LAB — C-REACTIVE PROTEIN: CRP: 1.1 mg/dL — ABNORMAL HIGH (ref <1.0)

## 2020-04-16 LAB — FERRITIN: Ferritin: 363 ng/mL — ABNORMAL HIGH (ref 11–307)

## 2020-04-16 LAB — PHOSPHORUS: Phosphorus: 3.7 mg/dL (ref 2.5–4.6)

## 2020-04-16 LAB — MAGNESIUM: Magnesium: 2.2 mg/dL (ref 1.7–2.4)

## 2020-04-16 NOTE — Discharge Summary (Signed)
Name: Brittney Lester MRN: [id number] DOB: [birth year] 39 y.o. PCP: Gerald Leitz, MD  Date of Admission: 04/13/2020  8:18 AM Date of Discharge:  Attending Physician: Miguel Aschoff, MD  Discharge Diagnosis: 1. Covid-19 Pneumonia  Discharge Medications: Allergies as of 04/16/2020   No Known Allergies     Medication List    STOP taking these medications   predniSONE 20 MG tablet Commonly known as: DELTASONE     TAKE these medications   5-HTP Caps Take 1 capsule by mouth daily. For anxiety/mood   acetaminophen 500 MG tablet Commonly known as: TYLENOL Take 1,000 mg by mouth every 6 (six) hours as needed for headache (pain).   albuterol 108 (90 Base) MCG/ACT inhaler Commonly known as: VENTOLIN HFA Inhale 1 puff into the lungs every 4 (four) hours as needed for wheezing or shortness of breath.   CALCIUM PO Take 1 tablet by mouth daily.   KRILL OIL PO Take 1 capsule by mouth daily.   OVER THE COUNTER MEDICATION Take 2 capsules by mouth daily. Plexus Biocleanse   OVER THE COUNTER MEDICATION Take 4 capsules by mouth at bedtime. 2 fruit/ 2 vegie   PROBIOTIC PO Take 1 capsule by mouth at bedtime. Vitalbiome What changed: Another medication with the same name was removed. Continue taking this medication, and follow the directions you see here.   VITAMIN C PO Take 1 tablet by mouth at bedtime.   Vitamin D (Ergocalciferol) 1.25 MG (50000 UNIT) Caps capsule Commonly known as: DRISDOL Take 1 capsule (50,000 Units total) by mouth every 7 (seven) days. What changed: when to take this   VITAMIN D-VITAMIN K PO Take 1 tablet by mouth at bedtime.   Zinc 50 MG Tabs Take 50 mg by mouth at bedtime. Notes to patient: 04/16/2020       Disposition and follow-up:   [name] was discharged from [facility] in Stable condition.  At the hospital follow up visit please address:  1. Covid-19 Pneumonia: 1st positive 9/13. Isolate until 9/27. No O2  requirement at d/c.   2.  Labs / imaging needed at time of follow-up: none  3.  Pending labs/ test needing follow-up: none  Follow-up Appointments:   Will establish with network provider near her home.   Hospital Course by problem list: 1. Covid-19 Pneumonia  Brittney Lester is a 39yo female with no PMH who presented with worsening respiratory status after recently being diagnosed with covid-19 on 9/13. She initially required a small amount of oxygen, demonstrated elevated inflammatory markers and cxr showed bilateral airspace disease consistent with multifocal pneumonia. She was treated with four days of decadron and remdesivir. Patient's respiratory status improved, and she was discharged without supplemental oxygen.   Discharge Vitals:   BP 116/69 (BP Location: Right Arm)   Pulse 66   Temp 98.7 F (37.1 C) (Oral)   Resp (!) 32   Ht 5\' 4"  (1.626 m)   Wt 135.6 kg   SpO2 94%   BMI 51.31 kg/m   Pertinent Labs, Studies, and Procedures:   CXR 04/13/20 Moderate patchy bilateral airspace process likely multifocal pneumonia.   Electronically Signed   By: 04/15/20 M.D.   On: 04/13/2020 09:49  Discharge Instructions: Discharge Instructions    Diet - low sodium heart healthy   Complete by: As directed    Discharge instructions   Complete by: As directed    You were hospitalized for covid-19 pneumonia. Thank you for allowing 04/15/2020 to  be part of your care.   Please continue quarantine until 9/27.   Increase activity slowly   Complete by: As directed       Signed: Versie Starks, DO 04/16/2020, 2:29 PM   Pager: [phone]

## 2020-04-16 NOTE — TOC Progression Note (Addendum)
Transition of Care [facility]) - Progression Note    Patient Details  Name: Brittney Lester MRN: [id number] Date of Birth: 02-Dec-1980  Transition of Care [facility]) CM/SW Contact  Beckie Busing, RN Phone Number: [phone]  04/16/2020, 12:38 PM  Clinical Narrative:    CM spoke with patient in reference to setting up PCP. Patient does not want to establish PCP with the clinics here at [facility]. Patient would like to select PCP from her [employer] network. Patient has been advised to follow recommendations for preferred providers from the BCBs list. Also patient has been advised to follow [email or web address] appointments. Patient states that she will find her own PCP.  No further needs noted. CM will sign off.      Barriers to Discharge: No Barriers Identified  Expected Discharge Plan and Services           Expected Discharge Date: 04/16/20               DME Arranged: N/A DME Agency: NA       HH Arranged: NA HH Agency: NA         Social Determinants of Health (SDOH) Interventions    Readmission Risk Interventions No flowsheet data found.

## 2020-04-16 NOTE — Progress Notes (Addendum)
   Subjective:   Feeling well this morning, no cough or SOB  Objective:  Vital signs in last 24 hours: Vitals:   04/14/20 1651 04/14/20 1800 04/14/20 2056 04/15/20 1936  BP: 114/74  111/87 118/72  Pulse: 80 60 61 68  Resp: (!) 27 16 (!) 25 16  Temp: 98.1 F (36.7 C)  98.5 F (36.9 C) 98.3 F (36.8 C)  TempSrc: Oral  Oral   SpO2: 98% 97% 98% 95%  Weight:      Height:        Supplemental 95% on RA  Constitution: NAD, supine in bed Cardio: RRR, no LE edema  Respiratory: non-labored breathing, on room air MSK: moving all extremities Neuro: normal affect, a&ox3, pleasant Skin: c/d/i   Assessment/Plan:  Principal Problem:   COVID-19 Active Problems:   Acute respiratory failure with hypoxia (HCC)   COVID-19 virus infection   Multifocal pneumonia  39 year old female with a past medical history of obesity, depression, anxiety Graves' disease no longer requiring treatment and gestational diabetes presenting with acute hypoxic respiratory failure secondary to Covid-19.  Initially tested positive on 9/13.  Patient has been symptomatic since 9/3 after her child tested positive.  Treated with outpatient therapy of ivermectin, steroids and inhalers to which she was nonresponsive.  Acute hypoxic respiratory failure COVID-19 Hospital day 4. Now on room air. Ambulated yesterday on room air.    - O2 saturation goal > 92% - tussionex q12h prn, robitussen q4h prn - remdesivir day 4/5 - decadron day 4 - IS, flutter valve - trend inflammatory markers - consult to [facility] for PCP - stable for discharge after remdesivir today  VTE prophylaxis: Lovenox Diet: Regular IV fluids: None CODE STATUS: Full  Dispo: Anticipated discharge today.   Lester, Brittney A, DO 04/16/2020, 6:31 AM Pager: [phone] After 5pm on weekdays and 1pm on weekends: On Call Pager: [phone]

## 2020-04-18 LAB — CULTURE, BLOOD (ROUTINE X 2)
Culture: NO GROWTH
Culture: NO GROWTH

## 2020-04-19 ENCOUNTER — Encounter (HOSPITAL_BASED_OUTPATIENT_CLINIC_OR_DEPARTMENT_OTHER): Payer: Self-pay

## 2020-04-19 ENCOUNTER — Other Ambulatory Visit: Payer: Self-pay

## 2020-04-19 ENCOUNTER — Emergency Department (HOSPITAL_BASED_OUTPATIENT_CLINIC_OR_DEPARTMENT_OTHER)
Admission: RE | Admit: 2020-04-19 | Discharge: 2020-04-19 | Disposition: A | Discharge status: Home / Self Care | Payer: BC Managed Care – PPO | Source: Ambulatory Visit | Attending: Nurse Practitioner | Admitting: Nurse Practitioner

## 2020-04-19 ENCOUNTER — Telehealth (INDEPENDENT_AMBULATORY_CARE_PROVIDER_SITE_OTHER): Payer: BC Managed Care – PPO | Admitting: Nurse Practitioner

## 2020-04-19 ENCOUNTER — Emergency Department (HOSPITAL_BASED_OUTPATIENT_CLINIC_OR_DEPARTMENT_OTHER)
Admission: EM | Admit: 2020-04-19 | Discharge: 2020-04-19 | Disposition: A | Discharge status: Home / Self Care | Payer: BC Managed Care – PPO | Attending: Emergency Medicine | Admitting: Emergency Medicine

## 2020-04-19 ENCOUNTER — Telehealth: Payer: Self-pay | Admitting: Family Medicine

## 2020-04-19 ENCOUNTER — Encounter: Payer: Self-pay | Admitting: Nurse Practitioner

## 2020-04-19 VITALS — HR 118 | Ht 64.0 in

## 2020-04-19 DIAGNOSIS — E119 Type 2 diabetes mellitus without complications: Secondary | ICD-10-CM | POA: Diagnosis not present

## 2020-04-19 DIAGNOSIS — Z7901 Long term (current) use of anticoagulants: Secondary | ICD-10-CM | POA: Diagnosis not present

## 2020-04-19 DIAGNOSIS — U071 COVID-19: Secondary | ICD-10-CM | POA: Diagnosis not present

## 2020-04-19 DIAGNOSIS — R0902 Hypoxemia: Secondary | ICD-10-CM | POA: Diagnosis present

## 2020-04-19 DIAGNOSIS — I2694 Multiple subsegmental pulmonary emboli without acute cor pulmonale: Secondary | ICD-10-CM | POA: Diagnosis not present

## 2020-04-19 DIAGNOSIS — R7989 Other specified abnormal findings of blood chemistry: Secondary | ICD-10-CM

## 2020-04-19 DIAGNOSIS — Z8616 Personal history of COVID-19: Secondary | ICD-10-CM | POA: Diagnosis not present

## 2020-04-19 DIAGNOSIS — J1282 Pneumonia due to coronavirus disease 2019: Secondary | ICD-10-CM

## 2020-04-19 LAB — CBC WITH DIFFERENTIAL/PLATELET
Abs Immature Granulocytes: 0.47 10*3/uL — ABNORMAL HIGH (ref 0.00–0.07)
Basophils Absolute: 0 10*3/uL (ref 0.0–0.1)
Basophils Relative: 0 %
Eosinophils Absolute: 0.1 10*3/uL (ref 0.0–0.5)
Eosinophils Relative: 1 %
HCT: 41.2 % (ref 36.0–46.0)
Hemoglobin: 14 g/dL (ref 12.0–15.0)
Immature Granulocytes: 4 %
Lymphocytes Relative: 18 %
Lymphs Abs: 2.4 10*3/uL (ref 0.7–4.0)
MCH: 30.4 pg (ref 26.0–34.0)
MCHC: 34 g/dL (ref 30.0–36.0)
MCV: 89.4 fL (ref 80.0–100.0)
Monocytes Absolute: 1 10*3/uL (ref 0.1–1.0)
Monocytes Relative: 7 %
Neutro Abs: 9.5 10*3/uL — ABNORMAL HIGH (ref 1.7–7.7)
Neutrophils Relative %: 70 %
Platelets: 233 10*3/uL (ref 150–400)
RBC: 4.61 MIL/uL (ref 3.87–5.11)
RDW: 14.4 % (ref 11.5–15.5)
WBC: 13.6 10*3/uL — ABNORMAL HIGH (ref 4.0–10.5)
nRBC: 0 % (ref 0.0–0.2)

## 2020-04-19 LAB — BASIC METABOLIC PANEL
Anion gap: 11 (ref 5–15)
BUN: 13 mg/dL (ref 6–20)
CO2: 25 mmol/L (ref 22–32)
Calcium: 8.3 mg/dL — ABNORMAL LOW (ref 8.9–10.3)
Chloride: 100 mmol/L (ref 98–111)
Creatinine, Ser: 1.05 mg/dL — ABNORMAL HIGH (ref 0.44–1.00)
GFR calc Af Amer: >60 mL/min (ref >60)
GFR calc non Af Amer: >60 mL/min (ref >60)
Glucose, Bld: 100 mg/dL — ABNORMAL HIGH (ref 70–99)
Potassium: 4.2 mmol/L (ref 3.5–5.1)
Sodium: 136 mmol/L (ref 135–145)

## 2020-04-19 LAB — APTT: aPTT: 29 seconds (ref 24–36)

## 2020-04-19 LAB — PROTIME-INR
INR: 1 (ref 0.8–1.2)
Prothrombin Time: 12.8 seconds (ref 11.4–15.2)

## 2020-04-19 LAB — TROPONIN I (HIGH SENSITIVITY): Troponin I (High Sensitivity): <2 ng/L (ref <18)

## 2020-04-19 MED ORDER — APIXABAN 2.5 MG PO TABS
ORAL_TABLET | ORAL | Status: AC
  Administered 2020-04-19: 10 mg via ORAL
  Filled 2020-04-19: qty 4

## 2020-04-19 MED ORDER — APIXABAN 2.5 MG PO TABS: 10.0000 mg | ORAL_TABLET | Freq: Once | ORAL | Status: AC

## 2020-04-19 MED ORDER — IOHEXOL 350 MG/ML SOLN
100.0000 mL | Freq: Once | INTRAVENOUS | Status: AC | PRN
  Administered 2020-04-19: 100 mL via INTRAVENOUS

## 2020-04-19 MED ORDER — APIXABAN (ELIQUIS) VTE STARTER PACK (10MG AND 5MG): ORAL_TABLET | ORAL | 0 refills | Status: DC

## 2020-04-19 NOTE — Progress Notes (Signed)
Virtual Visit via Video Note  I connected with@ on 04/19/20 at 10:15 AM EDT by a video enabled telemedicine application and verified that I am speaking with the correct person using two identifiers.  Location: Patient:Home Provider: Office Participants: patient and provider  I discussed the limitations of evaluation and management by telemedicine and the availability of in person appointments. I also discussed with the patient that there may be a patient responsible charge related to this service. The patient expressed understanding and agreed to proceed.  CC:New patient/ hospital f/u from positive COVID on 04/08/2020 and pt states she needs a pcp to follow up with.  History of Present Illness: Onset of symptoms 03/29/2020, positive COVID test on 04/08/2020, completed home isolation on 04/22/2020. Hospitalized 9/18-9/21/2021, CXR: positive pneumonia, positive D-dimer but CT chest completed, other lab results were stable. She was treated with decadron and remdesivir. O2Sat at discharge 94% on room air. Reports stable symptoms at this time. Home O2 sat has stayed at 94% since hospital discharge. S/p uterine ablation in 2018.amenorrhea No tobacco use of second hand exposure  Shortness of Breath This is a new problem. The current episode started 1 to 4 weeks ago. The problem occurs intermittently. The problem has been unchanged. Associated symptoms include orthopnea. Pertinent negatives include no abdominal pain, chest pain, claudication, coryza, ear pain, fever, headaches, hemoptysis, leg pain, leg swelling, neck pain, PND, rash, rhinorrhea, sore throat, sputum production, swollen glands, syncope, vomiting or wheezing. The symptoms are aggravated by any activity. Associated symptoms comments: Dizziness and fatigue. She has tried beta agonist inhalers and rest for the symptoms. The treatment provided significant relief. Her past medical history is significant for pneumonia. There is no history of  allergies, aspirin allergies, asthma, bronchiolitis, CAD, chronic lung disease, COPD, DVT, a heart failure, PE or a recent surgery.   Observations/Objective: Physical Exam Vitals reviewed.  Constitutional:      General: She is not in acute distress.    Appearance: She is not ill-appearing.  Pulmonary:     Effort: Pulmonary effort is normal. No respiratory distress.  Neurological:     Mental Status: She is alert and oriented to person, place, and time.  Psychiatric:        Mood and Affect: Mood normal.        Behavior: Behavior normal.        Thought Content: Thought content normal.    Assessment and Plan: Brittney Lester was seen today for establish care.  Diagnoses and all orders for this visit:  Pneumonia due to COVID-19 virus -     Cancel: DG Chest 2 View; Future -     CBC with Differential/Platelet; Future -     Comprehensive metabolic panel; Future -     CT ANGIO CHEST PE W OR WO CONTRAST; Future  Positive D dimer -     Cancel: DG Chest 2 View; Future -     CBC with Differential/Platelet; Future -     Comprehensive metabolic panel; Future -     CT ANGIO CHEST PE W OR WO CONTRAST; Future   Follow Up Instructions: Continue current symptoms management: adequate oral hydration, rest and nutrition. Use albuterol as needed. Ordered CT chest-stat Schedule for lab appt 04/23/2020 at 4pm   I discussed the assessment and treatment plan with the patient. The patient was provided an opportunity to ask questions and all were answered. The patient agreed with the plan and demonstrated an understanding of the instructions.   The patient was advised to  call back or seek an in-person evaluation if the symptoms worsen or if the condition fails to improve as anticipated.  [name], NP

## 2020-04-19 NOTE — ED Triage Notes (Signed)
Pt from radiology with +PE-pt with recent admn for "covid pneumonia"-NAD-steady gait

## 2020-04-19 NOTE — ED Provider Notes (Signed)
[facility] EMERGENCY DEPARTMENT Provider Note  CSN: [id number] Arrival date & time: 04/19/20 1920    History Chief Complaint  Patient presents with  . +PE    HPI  Brittney Lester is a 39 y.o. female with recent admission for covid pneumonia and hypoxia completed decadron and remdesivir and was discharged home 3 days ago. She has continued to have some SOB and DOE with SpO2 occasionally dropping to upper 80s with exertion but recovers quickly. She has continued to have a mild dry cough. No fever or CP. She was seen today as a video visit in follow up and noted to be tachycardic. Sent for outpatient CTA which showed multiple subsegmental PE. She was then sent to the ED for evaluation.    Past Medical History:  Diagnosis Date  . Anxiety   . COVID-19   . Depression   . Depression   . Diabetes mellitus without complication (HCC)    gestational  . Family history of adverse reaction to anesthesia    mother has nausea post surgery  . Fatigue   . Graves disease    no longer need medication treatment  . Graves disease   . HPV in female   . Joint pain   . Obesity   . Palpitations   . Prediabetes   . Swelling     Past Surgical History:  Procedure Laterality Date  . COLPOSCOPY    . DILITATION & CURRETTAGE/HYSTROSCOPY WITH HYDROTHERMAL ABLATION N/A 07/22/2016   Procedure: DILATATION & CURETTAGE/HYSTEROSCOPY WITH HYDROTHERMAL ABLATION;  Surgeon: Gerald Leitz, MD;  Location: [facility];  Service: Gynecology;  Laterality: N/A;  . LAPAROSCOPIC TUBAL LIGATION Bilateral 07/22/2016   Procedure: LAPAROSCOPIC TUBAL LIGATION;  Surgeon: Gerald Leitz, MD;  Location: [facility];  Service: Gynecology;  Laterality: Bilateral;  . TONSILLECTOMY      Family History  Problem Relation Age of Onset  . Hypertension Other   . Hypertension Mother   . Thyroid disease Mother   . High Cholesterol Mother   . Obesity Mother   . Heart disease Maternal Grandfather   . High blood pressure Father   . High  Cholesterol Father   . Obesity Father     Social History   Tobacco Use  . Smoking status: Never Smoker  . Smokeless tobacco: Never Used  Vaping Use  . Vaping Use: Never used  Substance Use Topics  . Alcohol use: Yes    Comment: occ  . Drug use: No     Home Medications Prior to Admission medications   Medication Sig Start Date End Date Taking? Authorizing Provider  acetaminophen (TYLENOL) 500 MG tablet Take 1,000 mg by mouth every 6 (six) hours as needed for headache (pain).    [provider]  albuterol (VENTOLIN HFA) 108 (90 Base) MCG/ACT inhaler Inhale 1 puff into the lungs every 4 (four) hours as needed for wheezing or shortness of breath.  04/08/20   [provider]  APIXABAN Everlene Balls) VTE STARTER PACK (10MG  AND 5MG ) Take as directed on package: start with two-5mg  tablets twice daily for 7 days. On day 8, switch to one-5mg  tablet twice daily. 04/19/20   , MD  Ascorbic Acid (VITAMIN C PO) Take 1 tablet by mouth at bedtime.    [provider]  Probiotic Product (PROBIOTIC PO) Take 1 capsule by mouth at bedtime. Vitalbiome    [provider]  Vitamin D, Ergocalciferol, (DRISDOL) 1.25 MG (50000 UNIT) CAPS capsule Take 1 capsule (50,000 Units  total) by mouth every 7 (seven) days. Patient taking differently: Take 50,000 Units by mouth every Sunday.  03/04/20   Helane Rima, DO  VITAMIN D-VITAMIN K PO Take 1 tablet by mouth at bedtime.    [provider]  Zinc 50 MG TABS Take 50 mg by mouth at bedtime.    [provider]     Allergies    Patient has no known allergies.   Review of Systems   Review of Systems A comprehensive review of systems was completed and negative except as noted in HPI.    Physical Exam BP (!) 107/58 (BP Location: Right Arm)   Pulse (!) 114   Temp 99.3 F (37.4 C) (Oral)   Resp (!) 34   Ht 5\' 4"  (1.626 m)   Wt 134.7 kg   SpO2 96%   BMI 50.98 kg/m   Physical Exam Vitals and  nursing note reviewed.  Constitutional:      Appearance: Normal appearance.  HENT:     Head: Normocephalic and atraumatic.     Nose: Nose normal.     Mouth/Throat:     Mouth: Mucous membranes are moist.  Eyes:     Extraocular Movements: Extraocular movements intact.     Conjunctiva/sclera: Conjunctivae normal.  Cardiovascular:     Rate and Rhythm: Tachycardia present.  Pulmonary:     Effort: Pulmonary effort is normal.     Breath sounds: Normal breath sounds.  Abdominal:     General: Abdomen is flat.     Palpations: Abdomen is soft.     Tenderness: There is no abdominal tenderness.  Musculoskeletal:        General: No swelling. Normal range of motion.     Cervical back: Neck supple.  Skin:    General: Skin is warm and dry.  Neurological:     General: No focal deficit present.     Mental Status: She is alert.  Psychiatric:        Mood and Affect: Mood normal.      ED Results / Procedures / Treatments   Labs (all labs ordered are listed, but only abnormal results are displayed) Labs Reviewed  CBC WITH DIFFERENTIAL/PLATELET - Abnormal; Notable for the following components:      Result Value   WBC 13.6 (*)    Neutro Abs 9.5 (*)    Abs Immature Granulocytes 0.47 (*)    All other components within normal limits  BASIC METABOLIC PANEL - Abnormal; Notable for the following components:   Glucose, Bld 100 (*)    Creatinine, Ser 1.05 (*)    Calcium 8.3 (*)    All other components within normal limits  PROTIME-INR  APTT  TROPONIN I (HIGH SENSITIVITY)    EKG EKG Interpretation  Date/Time:  Friday April 19 2020 19:29:11 EDT Ventricular Rate:  120 PR Interval:  128 QRS Duration: 62 QT Interval:  302 QTC Calculation: 426 R Axis:   36 Text Interpretation: Sinus tachycardia Nonspecific ST and T wave abnormality Abnormal ECG Since last tracing Rate faster Confirmed by 07-19-1992 [phone]) on 04/19/2020 8:11:09 PM    Radiology CT ANGIO CHEST PE W OR WO  CONTRAST  Addendum Date: 04/19/2020   ADDENDUM REPORT: 04/19/2020 19:16 ADDENDUM: Critical Value/emergent results were called by telephone at the time of interpretation on 04/19/2020 at 7:16 pm to Dr 04/21/2020 , who verbally acknowledged these results. The patient was waiting in the radiology department, and was referred directly to the emergency room. Electronically Signed  By: [name] RutherfordMelanie  Sanford M.D.   On: 04/19/2020 19:16   Result Date: 04/19/2020 CLINICAL DATA:  PE suspected, low/intermediate prob, positive D-dimer positive COVID on 04/08/2020, onset of symptoms 03/29/2020 EXAM: CT ANGIOGRAPHY CHEST WITH CONTRAST TECHNIQUE: Multidetector CT imaging of the chest was performed using the standard protocol during bolus administration of intravenous contrast. Multiplanar CT image reconstructions and MIPs were obtained to evaluate the vascular anatomy. CONTRAST:  100mL OMNIPAQUE IOHEXOL 350 MG/ML SOLN COMPARISON:  Radiograph 04/13/2020 FINDINGS: Cardiovascular: Positive for acute PE with filling defects involving the segmental branches of the right lower lobe, and possibly subsegmental branches in the right middle lobe. There is no evidence of right heart strain. Thoracic aorta is normal in caliber. No aortic dissection. Heart is normal in size. No pericardial effusion. Mediastinum/Nodes: Scattered small mediastinal lymph nodes are not enlarged by size criteria. Heterogeneous thyroid gland without focal nodule. There is no esophageal wall thickening. No pneumomediastinum. Lungs/Pleura: Diffuse patchy ground-glass opacities throughout both lungs consistent with COVID 19 pneumonia. No pneumothorax or pleural fluid. Upper Abdomen: No acute findings.  Suspected hepatic steatosis. Musculoskeletal: There are no acute or suspicious osseous abnormalities. Review of the MIP images confirms the above findings. IMPRESSION: 1. Positive for acute PE with filling defects involving the segmental branches of the right lower lobe,  and possibly subsegmental branches in the right middle lobe. No evidence of right heart strain. 2. Diffuse patchy ground-glass opacities throughout both lungs consistent with COVID 19 pneumonia. Parenchymal involvement is moderate to extensive. I am awaiting a return call from the referring clinician. Electronically Signed: By: [name] RutherfordMelanie  Sanford M.D. On: 04/19/2020 19:04    Procedures Procedures  Medications Ordered in the ED Medications  apixaban (ELIQUIS) tablet 10 mg (has no administration in time range)     MDM Rules/Calculators/A&P MDM CT results from outpatient scan reviewed, PE without CT signs of right heart strain, but patient is persistently tachycardic with reported episodes of exertional hypoxia at home. Will recheck labs to compare to recent values and discuss with inpatient team regarding anticoagulation of choice.  ED Course  I have reviewed the triage vital signs and the nursing notes.  Pertinent labs & imaging results that were available during my care of the patient were reviewed by me and considered in my medical decision making (see chart for details).  Clinical Course as of Apr 19 2148  Fri Apr 19, 2020  2031 Spoke with Dr. Nedra HaiLee, IM Resident who is familiar with the patient. He agrees with plan to give IVF, check labs and reassess patient if no change in labs and no elevated Trop can consider outpatient treatment with Eliquis if patient is comfortable with that.    [CS]  2048 Coags normal.    [CS]  2048 CBC with mild leukocytosis.   [CS]  2057 BMP normal.    [CS]  2143 Labs are unremarkable. Trop is neg and HR is improving. Patient is comfortable with outpatient management. Will start Eliquis, give Rx for loading dose and long term dose. Recommend close PCP follow up for long term management and advised to RTED for any worsening SOB, hypoxia or chest pain.    [CS]    Clinical Course User Index [CS] Pollyann SavoySheldon, Favor Hackler B, MD    Final Clinical Impression(s) / ED  Diagnoses Final diagnoses:  Multiple subsegmental pulmonary emboli without acute cor pulmonale (HCC)    Rx / DC Orders ED Discharge Orders         Ordered    APIXABAN (ELIQUIS)  VTE STARTER PACK (10MG  AND 5MG )        04/19/20 2148           , MD 04/19/20 2149

## 2020-04-19 NOTE — Patient Instructions (Addendum)
Continue current symptoms management: adequate oral hydration, rest and nutrition. Use albuterol as needed. Ordered CT chest-stat Schedule for lab appt 04/23/2020 at 4pm

## 2020-04-19 NOTE — Telephone Encounter (Signed)
Received call from radiologist, Dr Marisue Humble. Patient with PE and extensive COVID pneumonia. She noted the patient was still with them and I advised that the patient be taken to the ED for evaluation and management of this issue. She noted she would relay this recommendation to the technologist and they would get the patient to the ED. FYI to patients PCP.

## 2020-04-20 ENCOUNTER — Encounter: Payer: Self-pay | Admitting: Nurse Practitioner

## 2020-04-20 NOTE — Telephone Encounter (Signed)
Thank you for taking the call

## 2020-04-23 ENCOUNTER — Other Ambulatory Visit: Payer: BC Managed Care – PPO

## 2020-04-25 ENCOUNTER — Other Ambulatory Visit: Payer: Self-pay

## 2020-04-26 ENCOUNTER — Ambulatory Visit (INDEPENDENT_AMBULATORY_CARE_PROVIDER_SITE_OTHER): Payer: BC Managed Care – PPO | Admitting: Nurse Practitioner

## 2020-04-26 ENCOUNTER — Other Ambulatory Visit (INDEPENDENT_AMBULATORY_CARE_PROVIDER_SITE_OTHER): Payer: BC Managed Care – PPO

## 2020-04-26 ENCOUNTER — Encounter: Payer: Self-pay | Admitting: Nurse Practitioner

## 2020-04-26 VITALS — BP 116/86 | HR 92 | Temp 97.8°F | Ht 64.0 in | Wt 298.2 lb

## 2020-04-26 DIAGNOSIS — E559 Vitamin D deficiency, unspecified: Secondary | ICD-10-CM | POA: Diagnosis not present

## 2020-04-26 DIAGNOSIS — I2694 Multiple subsegmental pulmonary emboli without acute cor pulmonale: Secondary | ICD-10-CM | POA: Diagnosis not present

## 2020-04-26 DIAGNOSIS — J189 Pneumonia, unspecified organism: Secondary | ICD-10-CM

## 2020-04-26 LAB — BASIC METABOLIC PANEL
BUN: 12 mg/dL (ref 6–23)
CO2: 25 mEq/L (ref 19–32)
Calcium: 9 mg/dL (ref 8.4–10.5)
Chloride: 105 mEq/L (ref 96–112)
Creatinine, Ser: 0.84 mg/dL (ref 0.40–1.20)
GFR: 74.51 mL/min (ref >60.00)
Glucose, Bld: 91 mg/dL (ref 70–99)
Potassium: 4.2 mEq/L (ref 3.5–5.1)
Sodium: 139 mEq/L (ref 135–145)

## 2020-04-26 LAB — CBC
HCT: 37.8 % (ref 36.0–46.0)
Hemoglobin: 13 g/dL (ref 12.0–15.0)
MCHC: 34.4 g/dL (ref 30.0–36.0)
MCV: 88.9 fl (ref 78.0–100.0)
Platelets: 184 10*3/uL (ref 150.0–400.0)
RBC: 4.25 Mil/uL (ref 3.87–5.11)
RDW: 15 % (ref 11.5–15.5)
WBC: 8.9 10*3/uL (ref 4.0–10.5)

## 2020-04-26 MED ORDER — VITAMIN D 125 MCG (5000 UT) PO CAPS: 1.0000 | ORAL_CAPSULE | Freq: Every day | ORAL | Status: AC

## 2020-04-26 NOTE — Progress Notes (Signed)
Subjective:  Patient ID: Brittney Lester, female    DOB: 1981-03-28  Age: 39 y.o. MRN: [id number]  CC: Follow-up (Pt states she is still having some SOB, coughing, and chest discomfort when walking or talking for long periods of time. )  HPI  Multiple subsegmental pulmonary emboli without acute cor pulmonale (HCC) Current use of eliquis 5mg BID, initiated 04/19/2020 improving cough and SOB. Has not needed albuterol inhaler. No GI/GU/nose bleeding or abnormal bruising/hematoma or vaginal bleeding S/p uterine ablation 2018. Needs form completed for work  Maintain eliquis x 11months, then repeat CT chest. F/up in 2weeks   Reviewed past Medical, Social and Family history today.  Outpatient Medications Prior to Visit  Medication Sig Dispense Refill  . acetaminophen (TYLENOL) 500 MG tablet Take 1,000 mg by mouth every 6 (six) hours as needed for headache (pain).    1month albuterol (VENTOLIN HFA) 108 (90 Base) MCG/ACT inhaler Inhale 1 puff into the lungs every 4 (four) hours as needed for wheezing or shortness of breath.     . APIXABAN (ELIQUIS) VTE STARTER PACK (10MG  AND 5MG ) Take as directed on package: start with two-5mg  tablets twice daily for 7 days. On day 8, switch to one-5mg  tablet twice daily. 1 each 0  . Ascorbic Acid (VITAMIN C PO) Take 1 tablet by mouth at bedtime.    Marland Kitchen KRILL OIL PO Take by mouth.    . Nutritional Supplements (JUICE PLUS FIBRE PO) Take by mouth.    . Probiotic Product (PROBIOTIC PO) Take 1 capsule by mouth at bedtime. Vitalbiome    . VITAMIN D-VITAMIN K PO Take 1 tablet by mouth at bedtime.    . Zinc 50 MG TABS Take 50 mg by mouth at bedtime.    . Vitamin D, Ergocalciferol, (DRISDOL) 1.25 MG (50000 UNIT) CAPS capsule Take 1 capsule (50,000 Units total) by mouth every 7 (seven) days. (Patient not taking: Reported on 04/26/2020) 4 capsule 0   No facility-administered medications prior to visit.    ROS See HPI  Objective:  BP 116/86 (BP Location: Left Arm, Patient  Position: Sitting, Cuff Size: Normal)   Pulse 92   Temp 97.8 F (36.6 C) (Temporal)   Ht 5\' 4"  (1.626 m)   Wt 298 lb 3.2 oz (135.3 kg)   SpO2 96%   BMI 51.19 kg/m   Physical Exam Constitutional:      Appearance: She is obese.  Cardiovascular:     Rate and Rhythm: Normal rate and regular rhythm.     Pulses: Normal pulses.     Heart sounds: Normal heart sounds.  Pulmonary:     Effort: Pulmonary effort is normal.     Breath sounds: Normal breath sounds.  Abdominal:     General: There is no distension.     Palpations: Abdomen is soft.     Tenderness: There is no abdominal tenderness.  Musculoskeletal:     Right lower leg: No edema.     Left lower leg: No edema.  Skin:    Findings: No bruising or rash.  Neurological:     Mental Status: She is alert and oriented to person, place, and time.    Assessment & Plan:  This visit occurred during the SARS-CoV-2 public health emergency.  Safety protocols were in place, including screening questions prior to the visit, additional usage of staff PPE, and extensive cleaning of exam room while observing appropriate contact time as indicated for disinfecting solutions.   Brittney Lester was seen today for follow-up.  Diagnoses  and all orders for this visit:  Multiple subsegmental pulmonary emboli without acute cor pulmonale (HCC) -     Basic metabolic panel; Future -     CBC; Future  Vitamin D deficiency -     Cholecalciferol (VITAMIN D) 125 MCG (5000 UT) CAPS; Take 1 capsule by mouth daily at 12 noon. -     Vitamin D 1,25 dihydroxy; Future  Multifocal pneumonia -     CBC; Future  stable CBC and BMP Problem List Items Addressed This Visit      Cardiovascular and Mediastinum   Multiple subsegmental pulmonary emboli without acute cor pulmonale (HCC) - Primary    Current use of eliquis 5mg BID, initiated 04/19/2020 improving cough and SOB. Has not needed albuterol inhaler. No GI/GU/nose bleeding or abnormal bruising/hematoma or vaginal  bleeding S/p uterine ablation 2018. Needs form completed for work  Maintain eliquis x 24months, then repeat CT chest. F/up in 2weeks      Relevant Orders   Basic metabolic panel (Completed)   CBC (Completed)     Respiratory   Multifocal pneumonia   Relevant Orders   CBC (Completed)    Other Visit Diagnoses    Vitamin D deficiency       Relevant Medications   Cholecalciferol (VITAMIN D) 125 MCG (5000 UT) CAPS   Other Relevant Orders   Vitamin D 1,25 dihydroxy (Completed)      Follow-up: Return in about 2 weeks (around 05/10/2020) for pneumonia and PE (F2F).  05/12/2020, NP

## 2020-04-26 NOTE — Patient Instructions (Addendum)
Go for blood draw. Lab is located at [address] [place]  Start vitamin D 5000IU daily OTC while waiting for lab results.  Continue other medications.  Form will be completed for 04/22/2020 to 05/10/2020.  Stay active with frequent breaks.

## 2020-04-30 ENCOUNTER — Telehealth (INDEPENDENT_AMBULATORY_CARE_PROVIDER_SITE_OTHER): Payer: Self-pay | Admitting: Psychology

## 2020-04-30 LAB — VITAMIN D 1,25 DIHYDROXY
Vitamin D 1, 25 (OH)2 Total: 52 pg/mL (ref 18–72)
Vitamin D2 1, 25 (OH)2: 16 pg/mL
Vitamin D3 1, 25 (OH)2: 36 pg/mL

## 2020-04-30 NOTE — Telephone Encounter (Signed)
  Office: [phone]  /  Fax: [phone]  Date of Call: April 30, 2020  Time of Call: 11:14am Duration of Call: 3 minutes Provider: Lawerance Cruel, PsyD  CONTENT: This provider called Dorene Grebe to check-in and schedule a follow-up appointment. Karne shared about recent medical concerns secondary to COVID-19. She expressed a plan to call the clinic back to reschedule when she is feeling better. Notably, this provider discussed her upcoming maternity leave toward the end of November. Eleesha acknowledged understanding given the uncertain nature of the circumstances, this provider may be out of the office sooner. This provider and Dorene Grebe discussed referral options and verbal consent was provided for this provider to send a list of referral options via e-mail. All questions/concerns were addressed. Sherena denied any concerns. A brief risk assessment was completed. Henley denied experiencing suicidal and homicidal ideation, plan, or intent since the last appointment with this provider.   PLAN: This provider will wait for Nikya to call back to schedule a follow- up appointment. Referral options will be sent via e-mail. No further follow-up planned by this provider.

## 2020-05-01 ENCOUNTER — Encounter: Payer: Self-pay | Admitting: Nurse Practitioner

## 2020-05-01 DIAGNOSIS — I2694 Multiple subsegmental pulmonary emboli without acute cor pulmonale: Secondary | ICD-10-CM | POA: Insufficient documentation

## 2020-05-01 DIAGNOSIS — Z86711 Personal history of pulmonary embolism: Secondary | ICD-10-CM | POA: Insufficient documentation

## 2020-05-01 NOTE — Assessment & Plan Note (Signed)
Current use of eliquis 5mg BID, initiated 04/19/2020 improving cough and SOB. Has not needed albuterol inhaler. No GI/GU/nose bleeding or abnormal bruising/hematoma or vaginal bleeding S/p uterine ablation 2018. Needs form completed for work  Maintain eliquis x 28months, then repeat CT chest. F/up in 2weeks

## 2020-05-09 ENCOUNTER — Other Ambulatory Visit: Payer: Self-pay

## 2020-05-10 ENCOUNTER — Encounter: Payer: Self-pay | Admitting: Nurse Practitioner

## 2020-05-10 ENCOUNTER — Ambulatory Visit (INDEPENDENT_AMBULATORY_CARE_PROVIDER_SITE_OTHER): Payer: BC Managed Care – PPO | Admitting: Nurse Practitioner

## 2020-05-10 VITALS — BP 134/68 | HR 89 | Ht 64.0 in | Wt 301.0 lb

## 2020-05-10 DIAGNOSIS — I2694 Multiple subsegmental pulmonary emboli without acute cor pulmonale: Secondary | ICD-10-CM | POA: Diagnosis not present

## 2020-05-10 DIAGNOSIS — J189 Pneumonia, unspecified organism: Secondary | ICD-10-CM

## 2020-05-10 DIAGNOSIS — Z23 Encounter for immunization: Secondary | ICD-10-CM

## 2020-05-10 MED ORDER — APIXABAN 5 MG PO TABS: 5.0000 mg | ORAL_TABLET | Freq: Two times a day (BID) | ORAL | 0 refills | Status: DC

## 2020-05-10 NOTE — Patient Instructions (Signed)
Continue eliquis for another 26months You will be contacted to schedule CT chest in 96months. Call office if any signs of bleeding.

## 2020-05-10 NOTE — Progress Notes (Signed)
Subjective:  Patient ID: Brittney Lester, female    DOB: 12-25-1980  Age: 39 y.o. MRN: [id number]  CC: Follow-up  HPI  Multiple subsegmental pulmonary emboli without acute cor pulmonale (HCC) Resolved SOB and cough Normal O2 Sat today No sign of bleeding or abnormal bruising or ABD pain/nausea.  Continue eliquis for additional 9months Repeat CT chest in 92months.  Reviewed past Medical, Social and Family history today.  Outpatient Medications Prior to Visit  Medication Sig Dispense Refill  . acetaminophen (TYLENOL) 500 MG tablet Take 1,000 mg by mouth every 6 (six) hours as needed for headache (pain).    Marland Kitchen albuterol (VENTOLIN HFA) 108 (90 Base) MCG/ACT inhaler Inhale 1 puff into the lungs every 4 (four) hours as needed for wheezing or shortness of breath.     . APIXABAN (ELIQUIS) VTE STARTER PACK (10MG  AND 5MG ) Take as directed on package: start with two-5mg  tablets twice daily for 7 days. On day 8, switch to one-5mg  tablet twice daily. 1 each 0  . Ascorbic Acid (VITAMIN C PO) Take 1 tablet by mouth at bedtime.    . Cholecalciferol (VITAMIN D) 125 MCG (5000 UT) CAPS Take 1 capsule by mouth daily at 12 noon. 30 capsule   . KRILL OIL PO Take by mouth.    . Nutritional Supplements (JUICE PLUS FIBRE PO) Take by mouth.    . Probiotic Product (PROBIOTIC PO) Take 1 capsule by mouth at bedtime. Vitalbiome    . VITAMIN D-VITAMIN K PO Take 1 tablet by mouth at bedtime.    . Zinc 50 MG TABS Take 50 mg by mouth at bedtime.     No facility-administered medications prior to visit.    ROS See HPI  Objective:  BP 134/68   Pulse 89   Ht 5\' 4"  (1.626 m)   Wt (!) 301 lb (136.5 kg)   SpO2 98%   BMI 51.67 kg/m   Physical Exam Vitals reviewed.  Constitutional:      Appearance: She is obese.  Cardiovascular:     Rate and Rhythm: Normal rate and regular rhythm.     Pulses: Normal pulses.     Heart sounds: Normal heart sounds.  Pulmonary:     Effort: Pulmonary effort is normal.      Breath sounds: Normal breath sounds.  Abdominal:     Palpations: Abdomen is soft.     Tenderness: There is no abdominal tenderness.  Skin:    General: Skin is warm and dry.     Findings: No bruising, erythema or rash.  Neurological:     Mental Status: She is alert and oriented to person, place, and time.    Assessment & Plan:  This visit occurred during the SARS-CoV-2 public health emergency.  Safety protocols were in place, including screening questions prior to the visit, additional usage of staff PPE, and extensive cleaning of exam room while observing appropriate contact time as indicated for disinfecting solutions.   [name] was seen today for follow-up.  Diagnoses and all orders for this visit:  Multiple subsegmental pulmonary emboli without acute cor pulmonale (HCC) -     CT ANGIO CHEST PE W OR WO CONTRAST; Future  Multifocal pneumonia -     CT ANGIO CHEST PE W OR WO CONTRAST; Future  Need for immunization against influenza -     Flu Vaccine QUAD 36+ mos IM    Problem List Items Addressed This Visit      Cardiovascular and Mediastinum   Multiple subsegmental  pulmonary emboli without acute cor pulmonale (HCC) - Primary    Resolved SOB and cough Normal O2 Sat today No sign of bleeding or abnormal bruising or ABD pain/nausea.  Continue eliquis for additional 71months Repeat CT chest in 58months.      Relevant Orders   CT ANGIO CHEST PE W OR WO CONTRAST     Respiratory   Multifocal pneumonia   Relevant Orders   CT ANGIO CHEST PE W OR WO CONTRAST    Other Visit Diagnoses    Need for immunization against influenza       Relevant Orders   Flu Vaccine QUAD 36+ mos IM (Completed)      Follow-up: Return in about 3 months (around 08/12/2020) for CPE (fasting).  Brittney Penna, NP

## 2020-05-10 NOTE — Assessment & Plan Note (Signed)
Resolved SOB and cough Normal O2 Sat today No sign of bleeding or abnormal bruising or ABD pain/nausea.  Continue eliquis for additional 27months Repeat CT chest in 34months.

## 2020-05-10 NOTE — Telephone Encounter (Signed)
Refill sent 5mg  BID #120 to cover the next 2 mo snce [facility] OV note from today states pt is to continue med x 2 more months

## 2020-06-24 ENCOUNTER — Encounter: Payer: Self-pay | Admitting: Nurse Practitioner

## 2020-07-09 ENCOUNTER — Encounter: Payer: Self-pay | Admitting: Nurse Practitioner

## 2020-07-10 NOTE — Telephone Encounter (Signed)
Pt requested call back as soon as possible for this, please advise

## 2020-07-11 NOTE — Telephone Encounter (Signed)
Please see message and advise.  Thank you. ° °

## 2020-07-11 NOTE — Telephone Encounter (Signed)
.  bt 

## 2020-07-15 ENCOUNTER — Ambulatory Visit (INDEPENDENT_AMBULATORY_CARE_PROVIDER_SITE_OTHER): Payer: BC Managed Care – PPO | Admitting: Family

## 2020-07-15 ENCOUNTER — Other Ambulatory Visit: Payer: Self-pay

## 2020-07-15 ENCOUNTER — Encounter: Payer: Self-pay | Admitting: Family

## 2020-07-15 VITALS — BP 136/74 | HR 84 | Temp 97.1°F | Ht 64.0 in | Wt 304.2 lb

## 2020-07-15 DIAGNOSIS — L0291 Cutaneous abscess, unspecified: Secondary | ICD-10-CM

## 2020-07-15 NOTE — Progress Notes (Signed)
Acute Office Visit  Subjective:    Patient ID: Brittney Lester, female    DOB: 07-30-1980, 39 y.o.   MRN: [id number]  Chief Complaint  Patient presents with  . Mass    Lump on back noticed 1 week ago. Patients mother removed something from the bump would like this checked.     HPI Patient is in today with concerns of a bump on her back x 1 week. Her mother is a Engineer, civil (consulting) has has been squeezing pustular drainage from the area. It is less sore than it had been but she wants to have it checked.   Past Medical History:  Diagnosis Date  . Acute respiratory failure with hypoxia (HCC) 04/13/2020  . Anxiety   . COVID-19   . Depression   . Depression   . Diabetes mellitus without complication (HCC)    gestational  . Family history of adverse reaction to anesthesia    mother has nausea post surgery  . Fatigue   . Graves disease    no longer need medication treatment  . Graves disease   . HPV in female   . Joint pain   . Obesity   . Palpitations   . Prediabetes   . Swelling     Past Surgical History:  Procedure Laterality Date  . COLPOSCOPY    . DILITATION & CURRETTAGE/HYSTROSCOPY WITH HYDROTHERMAL ABLATION N/A 07/22/2016   Procedure: DILATATION & CURETTAGE/HYSTEROSCOPY WITH HYDROTHERMAL ABLATION;  Surgeon: Gerald Leitz, MD;  Location: [facility];  Service: Gynecology;  Laterality: N/A;  . LAPAROSCOPIC TUBAL LIGATION Bilateral 07/22/2016   Procedure: LAPAROSCOPIC TUBAL LIGATION;  Surgeon: Gerald Leitz, MD;  Location: [facility];  Service: Gynecology;  Laterality: Bilateral;  . TONSILLECTOMY      Family History  Problem Relation Age of Onset  . Hypertension Other   . Hypertension Mother   . Thyroid disease Mother   . High Cholesterol Mother   . Obesity Mother   . Heart disease Maternal Grandfather   . High blood pressure Father   . High Cholesterol Father   . Obesity Father     Social History   Socioeconomic History  . Marital status: Divorced    Spouse name: Not on file  . Number  of children: 3  . Years of education: Not on file  . Highest education level: Not on file  Occupational History  . Occupation: Doctor, general practice  Tobacco Use  . Smoking status: Never Smoker  . Smokeless tobacco: Never Used  Vaping Use  . Vaping Use: Never used  Substance and Sexual Activity  . Alcohol use: Yes    Comment: occ  . Drug use: No  . Sexual activity: Not on file  Other Topics Concern  . Not on file  Social History Narrative  . Not on file   Social Determinants of Health   Financial Resource Strain: Not on file  Food Insecurity: Not on file  Transportation Needs: Not on file  Physical Activity: Not on file  Stress: Not on file  Social Connections: Not on file  Intimate Partner Violence: Not on file    Outpatient Medications Prior to Visit  Medication Sig Dispense Refill  . acetaminophen (TYLENOL) 500 MG tablet Take 1,000 mg by mouth every 6 (six) hours as needed for headache (pain).    Marland Kitchen albuterol (VENTOLIN HFA) 108 (90 Base) MCG/ACT inhaler Inhale 1 puff into the lungs every 4 (four) hours as needed for wheezing or shortness of breath.     Marland Kitchen  apixaban (ELIQUIS) 5 MG TABS tablet Take 1 tablet (5 mg total) by mouth 2 (two) times daily. 120 tablet 0  . Cholecalciferol (VITAMIN D) 125 MCG (5000 UT) CAPS Take 1 capsule by mouth daily at 12 noon. 30 capsule   . Nutritional Supplements (JUICE PLUS FIBRE PO) Take by mouth.    . Probiotic Product (PROBIOTIC PO) Take 1 capsule by mouth at bedtime. Vitalbiome    . VITAMIN D-VITAMIN K PO Take 1 tablet by mouth at bedtime.    . Zinc 50 MG TABS Take 50 mg by mouth at bedtime.    . Ascorbic Acid (VITAMIN C PO) Take 1 tablet by mouth at bedtime. (Patient not taking: Reported on 07/15/2020)    . KRILL OIL PO Take by mouth. (Patient not taking: Reported on 07/15/2020)     No facility-administered medications prior to visit.    No Known Allergies  Review of Systems  Constitutional: Negative.   Respiratory: Negative.    Cardiovascular: Negative.   Skin: Positive for wound.       Sore bump on her back   Allergic/Immunologic: Negative.   Neurological: Negative.   Hematological: Negative.   Psychiatric/Behavioral: Negative.        Objective:    Physical Exam Constitutional:      Appearance: She is obese.  Cardiovascular:     Pulses: Normal pulses.     Heart sounds: Normal heart sounds.  Pulmonary:     Effort: Pulmonary effort is normal.     Breath sounds: Normal breath sounds.  Musculoskeletal:     Cervical back: Normal range of motion and neck supple.  Skin:    Comments: Incision and Drainage: Under sterile technique, 3 ccs of Lidocaine with epi used to anesthetize the sebaceous abcess on her upper middle back. 1/2 cm incision made to expel a large amount of pustular drainage. Patient tolerate procedure well. Dress and bandaged.   Neurological:     General: No focal deficit present.     Mental Status: She is alert and oriented to person, place, and time.  Psychiatric:        Mood and Affect: Mood normal.        Behavior: Behavior normal.     BP 136/74   Pulse 84   Temp (!) 97.1 F (36.2 C) (Temporal)   Ht 5\' 4"  (1.626 m)   Wt (!) 304 lb 3.2 oz (138 kg)   SpO2 96%   BMI 52.22 kg/m  Wt Readings from Last 3 Encounters:  07/15/20 (!) 304 lb 3.2 oz (138 kg)  05/10/20 (!) 301 lb (136.5 kg)  04/26/20 298 lb 3.2 oz (135.3 kg)    Health Maintenance Due  Topic Date Due  . Hepatitis C Screening  Never done  . COVID-19 Vaccine (1) Never done  . TETANUS/TDAP  Never done  . PAP SMEAR-Modifier  03/14/2019    There are no preventive care reminders to display for this patient.   Lab Results  Component Value Date   TSH 0.920 02/21/2020   Lab Results  Component Value Date   WBC 8.9 04/26/2020   HGB 13.0 04/26/2020   HCT 37.8 04/26/2020   MCV 88.9 04/26/2020   PLT 184.0 04/26/2020   Lab Results  Component Value Date   NA 139 04/26/2020   K 4.2 04/26/2020   CO2 25 04/26/2020    GLUCOSE 91 04/26/2020   BUN 12 04/26/2020   CREATININE 0.84 04/26/2020   BILITOT 1.3 (H) 04/16/2020   ALKPHOS  45 04/16/2020   AST 58 (H) 04/16/2020   ALT 91 (H) 04/16/2020   PROT 5.8 (L) 04/16/2020   ALBUMIN 2.9 (L) 04/16/2020   CALCIUM 9.0 04/26/2020   ANIONGAP 11 04/19/2020   GFR 74.51 04/26/2020   Lab Results  Component Value Date   CHOL 167 02/21/2020   Lab Results  Component Value Date   HDL 51 02/21/2020   Lab Results  Component Value Date   LDLCALC 97 02/21/2020   Lab Results  Component Value Date   TRIG 105 04/13/2020   Lab Results  Component Value Date   CHOLHDL 3.3 02/21/2020   Lab Results  Component Value Date   HGBA1C 4.9 02/21/2020       Assessment & Plan:   Problem List Items Addressed This Visit   None   Visit Diagnoses    Abscess    -  Primary      Call with any questions or concerns.    Eulis Foster, FNP

## 2020-07-15 NOTE — Patient Instructions (Signed)
Incision and Drainage, Care After This sheet gives you information about how to care for yourself after your procedure. Your health care provider may also give you more specific instructions. If you have problems or questions, contact your health care provider. What can I expect after the procedure? After the procedure, it is common to have:  Pain or discomfort around the incision site.  Blood, fluid, or pus (drainage) from the incision.  Redness and firm skin around the incision site. Follow these instructions at home: Medicines  Take over-the-counter and prescription medicines only as told by your health care provider.  If you were prescribed an antibiotic medicine, use or take it as told by your health care provider. Do not stop using the antibiotic even if you start to feel better. Wound care Follow instructions from your health care provider about how to take care of your wound. Make sure you:  Wash your hands with soap and water before and after you change your bandage (dressing). If soap and water are not available, use hand sanitizer.  Change your dressing and packing as told by your health care provider. ? If your dressing is dry or stuck when you try to remove it, moisten or wet the dressing with saline or water so that it can be removed without harming your skin or tissues. ? If your wound is packed, leave it in place until your health care provider tells you to remove it. To remove the packing, moisten or wet the packing with saline or water so that it can be removed without harming your skin or tissues.  Leave stitches (sutures), skin glue, or adhesive strips in place. These skin closures may need to stay in place for 2 weeks or longer. If adhesive strip edges start to loosen and curl up, you may trim the loose edges. Do not remove adhesive strips completely unless your health care provider tells you to do that. Check your wound every day for signs of infection. Check  for:  More redness, swelling, or pain.  More fluid or blood.  Warmth.  Pus or a bad smell. If you were sent home with a drain tube in place, follow instructions from your health care provider about:  How to empty it.  How to care for it at home.  General instructions  Rest the affected area.  Do not take baths, swim, or use a hot tub until your health care provider approves. Ask your health care provider if you may take showers. You may only be allowed to take sponge baths.  Return to your normal activities as told by your health care provider. Ask your health care provider what activities are safe for you. Your health care provider may put you on activity or lifting restrictions.  The incision will continue to drain. It is normal to have some clear or slightly bloody drainage. The amount of drainage should lessen each day.  Do not apply any creams, ointments, or liquids unless you have been told to by your health care provider.  Keep all follow-up visits as told by your health care provider. This is important. Contact a health care provider if:  Your cyst or abscess returns.  You have a fever or chills.  You have more redness, swelling, or pain around your incision.  You have more fluid or blood coming from your incision.  Your incision feels warm to the touch.  You have pus or a bad smell coming from your incision.  You have red streaks   above or below the incision site. Get help right away if:  You have severe pain or bleeding.  You cannot eat or drink without vomiting.  You have decreased urine output.  You become short of breath.  You have chest pain.  You cough up blood.  The affected area becomes numb or starts to tingle. These symptoms may represent a serious problem that is an emergency. Do not wait to see if the symptoms will go away. Get medical help right away. Call your local emergency services (911 in the [place] Do not drive yourself to the  hospital. Summary  After this procedure, it is common to have fluid, blood, or pus coming from the surgery site.  Follow all home care instructions. You will be told how to take care of your incision, how to check for infection, and how to take medicines.  If you were prescribed an antibiotic medicine, take it as told by your health care provider. Do not stop taking the antibiotic even if you start to feel better.  Contact a health care provider if you have increased redness, swelling, or pain around your incision. Get help right away if you have chest pain, you vomit, you cough up blood, or you have shortness of breath.  Keep all follow-up visits as told by your health care provider. This is important. This information is not intended to replace advice given to you by your health care provider. Make sure you discuss any questions you have with your health care provider. Document Revised: 06/13/2018 Document Reviewed: 06/13/2018 Elsevier Patient Education  2020 Elsevier Inc.   

## 2020-07-22 ENCOUNTER — Other Ambulatory Visit: Payer: Self-pay

## 2020-07-22 ENCOUNTER — Ambulatory Visit
Admission: RE | Admit: 2020-07-22 | Discharge: 2020-07-22 | Disposition: A | Discharge status: Home / Self Care | Payer: BC Managed Care – PPO | Source: Ambulatory Visit | Attending: Nurse Practitioner | Admitting: Nurse Practitioner

## 2020-07-22 ENCOUNTER — Other Ambulatory Visit: Payer: Self-pay | Admitting: Family

## 2020-07-22 DIAGNOSIS — I2694 Multiple subsegmental pulmonary emboli without acute cor pulmonale: Secondary | ICD-10-CM

## 2020-07-22 DIAGNOSIS — J189 Pneumonia, unspecified organism: Secondary | ICD-10-CM

## 2020-07-22 DIAGNOSIS — I2782 Chronic pulmonary embolism: Secondary | ICD-10-CM

## 2020-07-22 MED ORDER — IOPAMIDOL (ISOVUE-370) INJECTION 76%
75.0000 mL | Freq: Once | INTRAVENOUS | Status: AC | PRN
  Administered 2020-07-22: 75 mL via INTRAVENOUS

## 2020-07-22 NOTE — Telephone Encounter (Signed)
Brittney Lester, from [facility] calling to report CT chest scan. CT chest: Asked if Nche can read the the Scan, ASAP.

## 2020-07-22 NOTE — Telephone Encounter (Signed)
Please see message . Thank you .

## 2020-07-24 ENCOUNTER — Telehealth: Payer: Self-pay | Admitting: Family

## 2020-07-24 MED ORDER — APIXABAN 5 MG PO TABS: 5.0000 mg | ORAL_TABLET | Freq: Two times a day (BID) | ORAL | 0 refills | Status: DC

## 2020-07-24 NOTE — Telephone Encounter (Signed)
Okay to refill medication? Pt scheduled for referral placed in regards to this on 08/20/20

## 2020-07-24 NOTE — Addendum Note (Signed)
Addended by: Elyn Peers on: 07/24/2020 12:32 PM   Modules accepted: Orders

## 2020-07-24 NOTE — Telephone Encounter (Signed)
Patient is calling to let the office know that she only has 11 days worth of Eliquis left. She wants to know what she should do regarding a refill or discontinue until after appointment. Please call her back at  [phone] and advise.

## 2020-07-24 NOTE — Telephone Encounter (Signed)
I called and LMVM for patient regarding appointments scheduled.  New Patient packet w/ letter & calendar was mailed to patient as well.

## 2020-08-12 ENCOUNTER — Encounter: Payer: BC Managed Care – PPO | Admitting: Nurse Practitioner

## 2020-08-19 ENCOUNTER — Other Ambulatory Visit: Payer: Self-pay | Admitting: Family

## 2020-08-19 DIAGNOSIS — I2694 Multiple subsegmental pulmonary emboli without acute cor pulmonale: Secondary | ICD-10-CM

## 2020-08-19 DIAGNOSIS — D6859 Other primary thrombophilia: Secondary | ICD-10-CM

## 2020-08-20 ENCOUNTER — Inpatient Hospital Stay: Payer: BC Managed Care – PPO | Attending: Family

## 2020-08-20 ENCOUNTER — Ambulatory Visit: Payer: BC Managed Care – PPO | Attending: Internal Medicine

## 2020-08-20 ENCOUNTER — Other Ambulatory Visit: Payer: Self-pay

## 2020-08-20 ENCOUNTER — Inpatient Hospital Stay (HOSPITAL_BASED_OUTPATIENT_CLINIC_OR_DEPARTMENT_OTHER): Payer: BC Managed Care – PPO | Admitting: Family

## 2020-08-20 ENCOUNTER — Other Ambulatory Visit (HOSPITAL_BASED_OUTPATIENT_CLINIC_OR_DEPARTMENT_OTHER): Payer: Self-pay | Admitting: Internal Medicine

## 2020-08-20 ENCOUNTER — Encounter: Payer: Self-pay | Admitting: Family

## 2020-08-20 ENCOUNTER — Telehealth: Payer: Self-pay

## 2020-08-20 VITALS — BP 101/75 | HR 85 | Temp 98.8°F | Resp 16 | Wt 303.0 lb

## 2020-08-20 DIAGNOSIS — N921 Excessive and frequent menstruation with irregular cycle: Secondary | ICD-10-CM | POA: Diagnosis not present

## 2020-08-20 DIAGNOSIS — I2694 Multiple subsegmental pulmonary emboli without acute cor pulmonale: Secondary | ICD-10-CM | POA: Insufficient documentation

## 2020-08-20 DIAGNOSIS — M25461 Effusion, right knee: Secondary | ICD-10-CM | POA: Diagnosis not present

## 2020-08-20 DIAGNOSIS — Z23 Encounter for immunization: Secondary | ICD-10-CM

## 2020-08-20 DIAGNOSIS — D6859 Other primary thrombophilia: Secondary | ICD-10-CM

## 2020-08-20 DIAGNOSIS — Z7901 Long term (current) use of anticoagulants: Secondary | ICD-10-CM | POA: Insufficient documentation

## 2020-08-20 DIAGNOSIS — R200 Anesthesia of skin: Secondary | ICD-10-CM | POA: Diagnosis not present

## 2020-08-20 DIAGNOSIS — E162 Hypoglycemia, unspecified: Secondary | ICD-10-CM | POA: Diagnosis not present

## 2020-08-20 DIAGNOSIS — U099 Post covid-19 condition, unspecified: Secondary | ICD-10-CM | POA: Insufficient documentation

## 2020-08-20 DIAGNOSIS — R202 Paresthesia of skin: Secondary | ICD-10-CM | POA: Diagnosis not present

## 2020-08-20 DIAGNOSIS — Z8249 Family history of ischemic heart disease and other diseases of the circulatory system: Secondary | ICD-10-CM | POA: Diagnosis not present

## 2020-08-20 DIAGNOSIS — M79672 Pain in left foot: Secondary | ICD-10-CM | POA: Diagnosis not present

## 2020-08-20 LAB — ANTITHROMBIN III: AntiThromb III Func: 100 % (ref 75–120)

## 2020-08-20 LAB — CBC WITH DIFFERENTIAL (CANCER CENTER ONLY)
Abs Immature Granulocytes: 0.01 10*3/uL (ref 0.00–0.07)
Basophils Absolute: 0 10*3/uL (ref 0.0–0.1)
Basophils Relative: 0 %
Eosinophils Absolute: 0.1 10*3/uL (ref 0.0–0.5)
Eosinophils Relative: 1 %
HCT: 41.3 % (ref 36.0–46.0)
Hemoglobin: 14 g/dL (ref 12.0–15.0)
Immature Granulocytes: 0 %
Lymphocytes Relative: 24 %
Lymphs Abs: 1.8 10*3/uL (ref 0.7–4.0)
MCH: 29.4 pg (ref 26.0–34.0)
MCHC: 33.9 g/dL (ref 30.0–36.0)
MCV: 86.8 fL (ref 80.0–100.0)
Monocytes Absolute: 0.6 10*3/uL (ref 0.1–1.0)
Monocytes Relative: 8 %
Neutro Abs: 4.8 10*3/uL (ref 1.7–7.7)
Neutrophils Relative %: 67 %
Platelet Count: 170 10*3/uL (ref 150–400)
RBC: 4.76 MIL/uL (ref 3.87–5.11)
RDW: 13.8 % (ref 11.5–15.5)
WBC Count: 7.3 10*3/uL (ref 4.0–10.5)
nRBC: 0 % (ref 0.0–0.2)

## 2020-08-20 LAB — CMP (CANCER CENTER ONLY)
ALT: 26 U/L (ref 0–44)
AST: 25 U/L (ref 15–41)
Albumin: 4.3 g/dL (ref 3.5–5.0)
Alkaline Phosphatase: 61 U/L (ref 38–126)
Anion gap: 9 (ref 5–15)
BUN: 19 mg/dL (ref 6–20)
CO2: 25 mmol/L (ref 22–32)
Calcium: 9 mg/dL (ref 8.9–10.3)
Chloride: 104 mmol/L (ref 98–111)
Creatinine: 0.99 mg/dL (ref 0.44–1.00)
GFR, Estimated: >60 mL/min (ref >60)
Glucose, Bld: 105 mg/dL — ABNORMAL HIGH (ref 70–99)
Potassium: 3.7 mmol/L (ref 3.5–5.1)
Sodium: 138 mmol/L (ref 135–145)
Total Bilirubin: 1.3 mg/dL — ABNORMAL HIGH (ref 0.3–1.2)
Total Protein: 7.5 g/dL (ref 6.5–8.1)

## 2020-08-20 LAB — SAVE SMEAR(SSMR), FOR PROVIDER SLIDE REVIEW

## 2020-08-20 LAB — D-DIMER, QUANTITATIVE: D-Dimer, Quant: 0.42 ug/mL-FEU (ref 0.00–0.50)

## 2020-08-20 LAB — LACTATE DEHYDROGENASE: LDH: 173 U/L (ref 98–192)

## 2020-08-20 MED FILL — PFIZER-BIONTECH COVID-19 VA: 30 | 21 days supply | Qty: 0 | Fill #0

## 2020-08-20 NOTE — Telephone Encounter (Signed)
Pt aware of march f/u appts, aware that ct scan to be approved and also aware of u/s appt set for 08/23/20     [facility]

## 2020-08-20 NOTE — Progress Notes (Addendum)
Hematology/Oncology Consultation   Name: Brittney Lester      MRN: [id number]    Location: Room/bed info not found  Date: 08/20/2020 Time:9:01 AM   REFERRING PHYSICIAN: Charlotte L. Nche, NP  REASON FOR CONSULT: Multifocal subsegmental pulmonary emboli without acute cor pulmonale    DIAGNOSIS: Multifocal subsegmental pulmonary emboli without acute cor pulmonale   HISTORY OF PRESENT ILLNESS:  Brittney Lester is a very pleasant 40 yo caucasian female with diagnosis of acute PE of the right lower and possibly middle lobes of the lung, no right heart strain noted in September 2021. She was started on Eliquis and repeat CT angio in December 2021 showed significant resolution of PE with a focal embolus still noted in the proximal aspect of the posterior segment of the right upper lob pulmonary artery.  She had been diagnosed and hospitalized with Covid pneumonia in September 2021. She states that she had increased SOB and fatigue and was checked for PE after discharge.  She states that she has a old right knee injury and noted that she had more swelling and tenderness in the right knee while she had Covid and was concerned for DVT but no [place] has been done yet to assess.  She is doing well on Eliquis. No issues with bleeding. No abnormal bruising, no petechiae.  She has no prior history of thrombus. No family history of thrombus with she is aware of.  She is not on birth control. She had history of heavy irregular cycles and was on birth control when she was young with no issues. She had a uterine ablation and tubal ligation in 2017 and has not had any issues since.  She has 3 sons and thinks she may have had 1 miscarriage within the first few weeks.  She has history of precancerous cells of the cervix 20 years ago and colposcopy. No other issues since.  Maternal grandfather had history of heart attack in his 106's and was a smoker. No family history of stroke.   She has history of Grave's diease and states that  she was on an experimental medication regimen and her thyroid issues resolved with a year. She has not required treatment since and states that her thyroid is checked regularly with PCP.  No history of diabetes.   She notes fatigue and brain fog.  No fever, chills, n/v, cough, rash, dizziness, chest pain, palpitations, abdominal pain or changes in bowel or bladder habits.  She has some puffiness in her feet and ankles from being on her feet all day. She notes numbness and tingling in her feet at times as well as pain in the left heel from all the walking with her job. Pedal pulses are 2+.  She has occasional tenderness in the inner side of her right elbow. This comes and goes and has not effected her dexterity.  She admits that she does not eat throughout the work day and has issues with hypoglycemia.  No falls or syncope to report.  She also admits that she needs to hydrate more.  No smoking, ETOH or recreational drug use.  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   ROS: All other 10 point review of systems is negative.   PAST MEDICAL HISTORY:   Past Medical History:  Diagnosis Date  . Acute respiratory failure with hypoxia (HCC) 04/13/2020  . Anxiety   . COVID-19   . Depression   . Depression   . Diabetes mellitus  without complication (HCC)    gestational  . Family history of adverse reaction to anesthesia    mother has nausea post surgery  . Fatigue   . Graves disease    no longer need medication treatment  . Graves disease   . HPV in female   . Joint pain   . Obesity   . Palpitations   . Prediabetes   . Swelling     ALLERGIES: No Known Allergies    MEDICATIONS:  Current Outpatient Medications on File Prior to Visit  Medication Sig Dispense Refill  . acetaminophen (TYLENOL) 500 MG tablet Take 1,000 mg by mouth every 6 (six) hours as needed for headache (pain).    Marland Kitchen albuterol (VENTOLIN HFA) 108 (90 Base) MCG/ACT inhaler Inhale 1 puff  into the lungs every 4 (four) hours as needed for wheezing or shortness of breath.     Marland Kitchen apixaban (ELIQUIS) 5 MG TABS tablet Take 1 tablet (5 mg total) by mouth 2 (two) times daily. 120 tablet 0  . Ascorbic Acid (VITAMIN C PO) Take 1 tablet by mouth at bedtime. (Patient not taking: Reported on 07/15/2020)    . Cholecalciferol (VITAMIN D) 125 MCG (5000 UT) CAPS Take 1 capsule by mouth daily at 12 noon. 30 capsule   . KRILL OIL PO Take by mouth. (Patient not taking: Reported on 07/15/2020)    . Nutritional Supplements (JUICE PLUS FIBRE PO) Take by mouth.    . Probiotic Product (PROBIOTIC PO) Take 1 capsule by mouth at bedtime. Vitalbiome    . VITAMIN D-VITAMIN K PO Take 1 tablet by mouth at bedtime.    . Zinc 50 MG TABS Take 50 mg by mouth at bedtime.     No current facility-administered medications on file prior to visit.     PAST SURGICAL HISTORY Past Surgical History:  Procedure Laterality Date  . COLPOSCOPY    . DILITATION & CURRETTAGE/HYSTROSCOPY WITH HYDROTHERMAL ABLATION N/A 07/22/2016   Procedure: DILATATION & CURETTAGE/HYSTEROSCOPY WITH HYDROTHERMAL ABLATION;  Surgeon: Gerald Leitz, MD;  Location: [facility];  Service: Gynecology;  Laterality: N/A;  . LAPAROSCOPIC TUBAL LIGATION Bilateral 07/22/2016   Procedure: LAPAROSCOPIC TUBAL LIGATION;  Surgeon: Gerald Leitz, MD;  Location: [facility];  Service: Gynecology;  Laterality: Bilateral;  . TONSILLECTOMY      FAMILY HISTORY: Family History  Problem Relation Age of Onset  . Hypertension Other   . Hypertension Mother   . Thyroid disease Mother   . High Cholesterol Mother   . Obesity Mother   . Heart disease Maternal Grandfather   . High blood pressure Father   . High Cholesterol Father   . Obesity Father     SOCIAL HISTORY:  reports that she has never smoked. She has never used smokeless tobacco. She reports current alcohol use. She reports that she does not use drugs.  PERFORMANCE STATUS: The patient's performance status is 1 -  Symptomatic but completely ambulatory  PHYSICAL EXAM: Most Recent Vital Signs: There were no vitals taken for this visit. BP 101/75 (Patient Position: Sitting)   Pulse 85   Temp 98.8 F (37.1 C) (Oral)   Resp 16   Wt (!) 303 lb (137.4 kg)   SpO2 99%   BMI 52.01 kg/m   General Appearance:    Alert, cooperative, no distress, appears stated age  Head:    Normocephalic, without obvious abnormality, atraumatic  Eyes:    PERRL, conjunctiva/corneas clear, EOM's intact, fundi    benign, both eyes  Throat:   Lips, mucosa, and tongue normal; teeth and gums normal  Neck:   Supple, symmetrical, trachea midline, no adenopathy;    thyroid:  no enlargement/tenderness/nodules; no carotid   bruit or JVD  Back:     Symmetric, no curvature, ROM normal, no CVA tenderness  Lungs:     Clear to auscultation bilaterally, respirations unlabored  Chest Wall:    No tenderness or deformity   Heart:    Regular rate and rhythm, S1 and S2 normal, no murmur, rub   or gallop     Abdomen:     Soft, non-tender, bowel sounds active all four quadrants,    no masses, no organomegaly        Extremities:   Extremities normal, atraumatic, no cyanosis or edema  Pulses:   2+ and symmetric all extremities  Skin:   Skin color, texture, turgor normal, no rashes or lesions  Lymph nodes:   Cervical, supraclavicular, and axillary nodes normal  Neurologic:   CNII-XII intact, normal strength, sensation and reflexes    throughout    LABORATORY DATA:  Results for orders placed or performed in visit on 08/20/20 (from the past 48 hour(s))  Save Smear (SSMR)     Status: None   Collection Time: 08/20/20  8:30 AM  Result Value Ref Range   Smear Review SMEAR STAINED AND AVAILABLE FOR REVIEW     Comment: Performed at [facility] Lab at [facility], [address] [place], [place] [address]  CBC with Differential (Cancer Center Only)     Status: None   Collection Time: 08/20/20  8:30 AM  Result  Value Ref Range   WBC Count 7.3 4.0 - 10.5 K/uL   RBC 4.76 3.87 - 5.11 MIL/uL   Hemoglobin 14.0 12.0 - 15.0 g/dL   HCT 73.7 10.6 - 26.9 %   MCV 86.8 80.0 - 100.0 fL   MCH 29.4 26.0 - 34.0 pg   MCHC 33.9 30.0 - 36.0 g/dL   RDW 48.5 46.2 - 70.3 %   Platelet Count 170 150 - 400 K/uL   nRBC 0.0 0.0 - 0.2 %   Neutrophils Relative % 67 %   Neutro Abs 4.8 1.7 - 7.7 K/uL   Lymphocytes Relative 24 %   Lymphs Abs 1.8 0.7 - 4.0 K/uL   Monocytes Relative 8 %   Monocytes Absolute 0.6 0.1 - 1.0 K/uL   Eosinophils Relative 1 %   Eosinophils Absolute 0.1 0.0 - 0.5 K/uL   Basophils Relative 0 %   Basophils Absolute 0.0 0.0 - 0.1 K/uL   Immature Granulocytes 0 %   Abs Immature Granulocytes 0.01 0.00 - 0.07 K/uL    Comment: Performed at [facility] Lab at [facility], [address] [place], [place] [address]      RADIOGRAPHY: No results found.     PATHOLOGY: None  ASSESSMENT/PLAN: Ms. Licht is a very pleasant 40 yo caucasian female with diagnosis of PE while having Covid.  D-dimer is down to 0.42.  Repeat CT angio in December showed a nice response to Eliquis. She will continue her same regimen.  We will see what her hyper coag panel reveals and if there is any underlying reason for her to clot.  We will get an [place] this week to assess for DVT.  We will plan to see her again in 8 weeks and will repeat another CT angio to see if the last bit of the  clot has resolved.   All questions were answered and she is in agreement with the plan. She can contact our office with any questions or concerns. We can certainly see her sooner if needed.    Emeline GinsSarah [place], NP

## 2020-08-20 NOTE — Progress Notes (Signed)
   Covid-19 Vaccination Clinic  Name:  Brittney Lester    MRN: [id number] DOB: 12-Dec-1980  08/20/2020  [name] was observed post Covid-19 immunization for 15 minutes without incident. She was provided with Vaccine Information Sheet and instruction to access the V-Safe system.  Vaccinated By: Bjorn Pippin  [name] was instructed to call 911 with any severe reactions post vaccine: Marland Kitchen Difficulty breathing  . Swelling of face and throat  . A fast heartbeat  . A bad rash all over body  . Dizziness and weakness   Immunizations Administered    Name Date Dose VIS Date Route   Pfizer COVID-19 Vaccine 08/20/2020 10:31 AM 0.3 mL 05/15/2020 Intramuscular   Manufacturer: [employer], [employer]   Lot: [id number]   NDC: [address]-5

## 2020-08-21 LAB — LUPUS ANTICOAGULANT PANEL
DRVVT: 42.6 s (ref 0.0–47.0)
PTT Lupus Anticoagulant: 45.2 s (ref 0.0–51.9)

## 2020-08-21 LAB — BETA-2-GLYCOPROTEIN I ABS, IGG/M/A
Beta-2 Glyco I IgG: <9 GPI IgG units (ref 0–20)
Beta-2-Glycoprotein I IgA: <9 GPI IgA units (ref 0–25)
Beta-2-Glycoprotein I IgM: <9 GPI IgM units (ref 0–32)

## 2020-08-21 LAB — HOMOCYSTEINE: Homocysteine: 8.9 umol/L (ref 0.0–14.5)

## 2020-08-21 LAB — PROTEIN S ACTIVITY: Protein S Activity: 91 % (ref 63–140)

## 2020-08-21 LAB — PROTEIN C ACTIVITY: Protein C Activity: 130 % (ref 73–180)

## 2020-08-21 LAB — PROTEIN S, TOTAL: Protein S Ag, Total: 91 % (ref 60–150)

## 2020-08-22 LAB — PROTEIN C, TOTAL: Protein C, Total: 116 % (ref 60–150)

## 2020-08-22 LAB — CARDIOLIPIN ANTIBODIES, IGG, IGM, IGA
Anticardiolipin IgA: <9 APL U/mL (ref 0–11)
Anticardiolipin IgG: <9 GPL U/mL (ref 0–14)
Anticardiolipin IgM: 11 MPL U/mL (ref 0–12)

## 2020-08-23 ENCOUNTER — Ambulatory Visit (HOSPITAL_BASED_OUTPATIENT_CLINIC_OR_DEPARTMENT_OTHER)
Admission: RE | Admit: 2020-08-23 | Discharge: 2020-08-23 | Disposition: A | Discharge status: Home / Self Care | Payer: BC Managed Care – PPO | Source: Ambulatory Visit | Attending: Family | Admitting: Family

## 2020-08-23 ENCOUNTER — Other Ambulatory Visit: Payer: Self-pay

## 2020-08-23 DIAGNOSIS — D6859 Other primary thrombophilia: Secondary | ICD-10-CM | POA: Diagnosis present

## 2020-08-23 DIAGNOSIS — I2694 Multiple subsegmental pulmonary emboli without acute cor pulmonale: Secondary | ICD-10-CM | POA: Insufficient documentation

## 2020-08-26 ENCOUNTER — Telehealth: Payer: Self-pay | Admitting: *Deleted

## 2020-08-26 LAB — FACTOR 5 LEIDEN

## 2020-08-26 LAB — PROTHROMBIN GENE MUTATION

## 2020-08-26 NOTE — Telephone Encounter (Signed)
-----   Message from Verdie Mosher, NP sent at 08/26/2020  1:35 PM EST ----- No DVT noted!!!! WOO HOO!!!!!!   ----- Message ----- From: Interface, Rad Results In Sent: 08/23/2020   6:22 PM EST To: Verdie Mosher, NP

## 2020-08-26 NOTE — Telephone Encounter (Signed)
As noted below by Emeline Gins, NP, I informed the patient that she doesn't have a DVT. Patient verbalized understanding.

## 2020-08-28 ENCOUNTER — Telehealth: Payer: Self-pay

## 2020-08-28 NOTE — Telephone Encounter (Signed)
-----   Message from Verdie Mosher, NP sent at 08/28/2020 12:49 PM EST ----- Hyper coag workup was negative!!!!!!! No genetic reason to clot found :) WOOP WOOP!!!!!   ----- Message ----- From: Leory Plowman, Lab In [place] Sent: 08/20/2020   8:48 AM EST To: Verdie Mosher, NP

## 2020-08-28 NOTE — Telephone Encounter (Signed)
Called and informed patient of information per Maralyn Sago. PA patient verbalized understanding and denies any concerns at this time.

## 2020-09-10 ENCOUNTER — Encounter: Payer: BC Managed Care – PPO | Admitting: Nurse Practitioner

## 2020-10-18 ENCOUNTER — Other Ambulatory Visit (HOSPITAL_BASED_OUTPATIENT_CLINIC_OR_DEPARTMENT_OTHER): Payer: Self-pay | Admitting: Nurse Practitioner

## 2020-10-18 ENCOUNTER — Other Ambulatory Visit: Payer: Self-pay

## 2020-10-18 ENCOUNTER — Inpatient Hospital Stay: Payer: BC Managed Care – PPO | Attending: Family

## 2020-10-18 ENCOUNTER — Ambulatory Visit (HOSPITAL_BASED_OUTPATIENT_CLINIC_OR_DEPARTMENT_OTHER)
Admission: RE | Admit: 2020-10-18 | Discharge: 2020-10-18 | Disposition: A | Discharge status: Home / Self Care | Payer: BC Managed Care – PPO | Source: Ambulatory Visit | Attending: Family | Admitting: Family

## 2020-10-18 ENCOUNTER — Encounter (HOSPITAL_BASED_OUTPATIENT_CLINIC_OR_DEPARTMENT_OTHER): Payer: Self-pay

## 2020-10-18 ENCOUNTER — Ambulatory Visit (INDEPENDENT_AMBULATORY_CARE_PROVIDER_SITE_OTHER): Payer: BC Managed Care – PPO | Admitting: Nurse Practitioner

## 2020-10-18 ENCOUNTER — Encounter: Payer: Self-pay | Admitting: Family

## 2020-10-18 ENCOUNTER — Encounter: Payer: Self-pay | Admitting: Nurse Practitioner

## 2020-10-18 ENCOUNTER — Inpatient Hospital Stay: Payer: BC Managed Care – PPO | Admitting: Family

## 2020-10-18 ENCOUNTER — Other Ambulatory Visit: Payer: BC Managed Care – PPO

## 2020-10-18 VITALS — BP 130/82 | HR 93 | Temp 97.3°F | Ht 65.75 in | Wt 310.6 lb

## 2020-10-18 VITALS — BP 132/86 | HR 83 | Temp 98.1°F | Resp 18 | Ht 65.0 in | Wt 310.0 lb

## 2020-10-18 DIAGNOSIS — Z0001 Encounter for general adult medical examination with abnormal findings: Secondary | ICD-10-CM

## 2020-10-18 DIAGNOSIS — F324 Major depressive disorder, single episode, in partial remission: Secondary | ICD-10-CM | POA: Diagnosis not present

## 2020-10-18 DIAGNOSIS — Z7901 Long term (current) use of anticoagulants: Secondary | ICD-10-CM | POA: Insufficient documentation

## 2020-10-18 DIAGNOSIS — Z8616 Personal history of COVID-19: Secondary | ICD-10-CM | POA: Diagnosis not present

## 2020-10-18 DIAGNOSIS — Z13220 Encounter for screening for lipoid disorders: Secondary | ICD-10-CM

## 2020-10-18 DIAGNOSIS — F339 Major depressive disorder, recurrent, unspecified: Secondary | ICD-10-CM | POA: Insufficient documentation

## 2020-10-18 DIAGNOSIS — D6859 Other primary thrombophilia: Secondary | ICD-10-CM

## 2020-10-18 DIAGNOSIS — F32A Depression, unspecified: Secondary | ICD-10-CM | POA: Insufficient documentation

## 2020-10-18 DIAGNOSIS — E559 Vitamin D deficiency, unspecified: Secondary | ICD-10-CM

## 2020-10-18 DIAGNOSIS — R739 Hyperglycemia, unspecified: Secondary | ICD-10-CM

## 2020-10-18 DIAGNOSIS — I2694 Multiple subsegmental pulmonary emboli without acute cor pulmonale: Secondary | ICD-10-CM | POA: Diagnosis present

## 2020-10-18 DIAGNOSIS — Z86711 Personal history of pulmonary embolism: Secondary | ICD-10-CM | POA: Diagnosis present

## 2020-10-18 DIAGNOSIS — R5382 Chronic fatigue, unspecified: Secondary | ICD-10-CM | POA: Diagnosis not present

## 2020-10-18 DIAGNOSIS — Z136 Encounter for screening for cardiovascular disorders: Secondary | ICD-10-CM

## 2020-10-18 DIAGNOSIS — Z6841 Body Mass Index (BMI) 40.0 and over, adult: Secondary | ICD-10-CM

## 2020-10-18 DIAGNOSIS — Z8639 Personal history of other endocrine, nutritional and metabolic disease: Secondary | ICD-10-CM

## 2020-10-18 DIAGNOSIS — Z1231 Encounter for screening mammogram for malignant neoplasm of breast: Secondary | ICD-10-CM

## 2020-10-18 LAB — CMP (CANCER CENTER ONLY)
ALT: 26 U/L (ref 0–44)
AST: 20 U/L (ref 15–41)
Albumin: 4.3 g/dL (ref 3.5–5.0)
Alkaline Phosphatase: 61 U/L (ref 38–126)
Anion gap: 6 (ref 5–15)
BUN: 16 mg/dL (ref 6–20)
CO2: 30 mmol/L (ref 22–32)
Calcium: 9.2 mg/dL (ref 8.9–10.3)
Chloride: 104 mmol/L (ref 98–111)
Creatinine: 0.9 mg/dL (ref 0.44–1.00)
GFR, Estimated: >60 mL/min (ref >60)
Glucose, Bld: 116 mg/dL — ABNORMAL HIGH (ref 70–99)
Potassium: 3.7 mmol/L (ref 3.5–5.1)
Sodium: 140 mmol/L (ref 135–145)
Total Bilirubin: 0.9 mg/dL (ref 0.3–1.2)
Total Protein: 7.1 g/dL (ref 6.5–8.1)

## 2020-10-18 LAB — CBC WITH DIFFERENTIAL (CANCER CENTER ONLY)
Abs Immature Granulocytes: 0.03 10*3/uL (ref 0.00–0.07)
Basophils Absolute: 0 10*3/uL (ref 0.0–0.1)
Basophils Relative: 1 %
Eosinophils Absolute: 0.1 10*3/uL (ref 0.0–0.5)
Eosinophils Relative: 2 %
HCT: 40 % (ref 36.0–46.0)
Hemoglobin: 13.6 g/dL (ref 12.0–15.0)
Immature Granulocytes: 0 %
Lymphocytes Relative: 23 %
Lymphs Abs: 1.9 10*3/uL (ref 0.7–4.0)
MCH: 30.2 pg (ref 26.0–34.0)
MCHC: 34 g/dL (ref 30.0–36.0)
MCV: 88.7 fL (ref 80.0–100.0)
Monocytes Absolute: 0.6 10*3/uL (ref 0.1–1.0)
Monocytes Relative: 7 %
Neutro Abs: 5.4 10*3/uL (ref 1.7–7.7)
Neutrophils Relative %: 67 %
Platelet Count: 184 10*3/uL (ref 150–400)
RBC: 4.51 MIL/uL (ref 3.87–5.11)
RDW: 13.9 % (ref 11.5–15.5)
WBC Count: 8.1 10*3/uL (ref 4.0–10.5)
nRBC: 0 % (ref 0.0–0.2)

## 2020-10-18 LAB — LACTATE DEHYDROGENASE: LDH: 171 U/L (ref 98–192)

## 2020-10-18 LAB — D-DIMER, QUANTITATIVE: D-Dimer, Quant: 0.3 ug/mL-FEU (ref 0.00–0.50)

## 2020-10-18 MED ORDER — APIXABAN 5 MG PO TABS: 5.0000 mg | ORAL_TABLET | Freq: Two times a day (BID) | ORAL | 0 refills | Status: DC

## 2020-10-18 MED ORDER — IOHEXOL 350 MG/ML SOLN
100.0000 mL | Freq: Once | INTRAVENOUS | Status: AC | PRN
  Administered 2020-10-18: 100 mL via INTRAVENOUS

## 2020-10-18 NOTE — Progress Notes (Signed)
Hematology and Oncology Follow Up Visit  Brittney Lester [id number] [birth year] 40 y.o. 10/18/2020   Principle Diagnosis:  Multifocal subsegmental pulmonary emboli without acute cor pulmonale   Current Therapy:   Eliquis 5 mg PO BID   Interim History:  Brittney Lester is here today for follow-up. She is doing well and has no complaints at this time.  CT angio today showed resolution or PE.  She is doing well on Eliquis and verbalized that she is taking as prescribed. No issues with blood loss. No bruising or petechiae.  No fever, chills, n/v cough, rash, dizziness, SOB, chest pain, palpitations, abdominal pain or changes in bowel or bladder habits.  No swelling, tenderness, numbness or tingling in her extremities.  No falls or syncope. She has maintained a good appetite and is staying well hydrated. Her weight is stable.    ECOG Performance Status: 1 - Symptomatic but completely ambulatory  Medications:  Allergies as of 10/18/2020   No Known Allergies     Medication List       Accurate as of October 18, 2020  4:47 PM. If you have any questions, ask your nurse or doctor.        STOP taking these medications   JUICE PLUS FIBRE PO Stopped by: [name], NP   KRILL OIL PO Stopped by: [name], NP   PROBIOTIC PO Stopped by: [name], NP     TAKE these medications   acetaminophen 500 MG tablet Commonly known as: TYLENOL Take 1,000 mg by mouth every 6 (six) hours as needed for headache (pain).   albuterol 108 (90 Base) MCG/ACT inhaler Commonly known as: VENTOLIN HFA Inhale 1 puff into the lungs every 4 (four) hours as needed for wheezing or shortness of breath.   apixaban 5 MG Tabs tablet Commonly known as: Eliquis Take 1 tablet (5 mg total) by mouth 2 (two) times daily.   VITAMIN C PO Take 1 tablet by mouth at bedtime.   Vitamin D 125 MCG (5000 UT) Caps Take 1 capsule by mouth daily at 12 noon.   Zinc 50 MG Tabs Take 50 mg by mouth at bedtime.        Allergies: No Known Allergies  Past Medical History, Surgical history, Social history, and Family History were reviewed and updated.  Review of Systems: All other 10 point review of systems is negative.   Physical Exam:  height is 5\' 5"  (1.651 m) and weight is 310 lb (140.6 kg) (abnormal). Her oral temperature is 98.1 F (36.7 C). Her blood pressure is 132/86 and her pulse is 83. Her respiration is 18 and oxygen saturation is 100%.   Wt Readings from Last 3 Encounters:  10/18/20 (!) 310 lb (140.6 kg)  10/18/20 (!) 310 lb 9.6 oz (140.9 kg)  08/20/20 (!) 303 lb (137.4 kg)    Ocular: Sclerae unicteric, pupils equal, round and reactive to light Ear-nose-throat: Oropharynx clear, dentition fair Lymphatic: No cervical or supraclavicular adenopathy Lungs no rales or rhonchi, good excursion bilaterally Heart regular rate and rhythm, no murmur appreciated Abd soft, nontender, positive bowel sounds MSK no focal spinal tenderness, no joint edema Neuro: non-focal, well-oriented, appropriate affect Breasts: Deferred   Lab Results  Component Value Date   WBC 8.1 10/18/2020   HGB 13.6 10/18/2020   HCT 40.0 10/18/2020   MCV 88.7 10/18/2020   PLT 184 10/18/2020   Lab Results  Component Value Date   FERRITIN 363 (H) 04/16/2020   Lab Results  Component Value  Date   RBC 4.51 10/18/2020   No results found for: KPAFRELGTCHN, LAMBDASER, KAPLAMBRATIO No results found for: IGGSERUM, IGA, IGMSERUM No results found for: [name], SPEI   Chemistry      Component Value Date/Time   NA 140 10/18/2020 1337   NA 140 02/21/2020 1639   K 3.7 10/18/2020 1337   CL 104 10/18/2020 1337   CO2 30 10/18/2020 1337   BUN 16 10/18/2020 1337   BUN 11 02/21/2020 1639   CREATININE 0.90 10/18/2020 1337      Component Value Date/Time   CALCIUM 9.2 10/18/2020 1337   ALKPHOS 61 10/18/2020 1337   AST 20 10/18/2020 1337   ALT 26 10/18/2020 1337    BILITOT 0.9 10/18/2020 1337       Impression and Plan: Brittney Lester is a very pleasant 40 yo caucasian female with diagnosis of PE while having Covid. Hyper coag work up was negative.  She continues to do well and CT angio today showed resolution of PE.  She will continue her same regimen for now.  We will see her again in 6 months and then look at switching her to maintenance for one year.  She can contact our office with any questions or concerns.   Emeline Gins, NP 3/25/20224:47 PM

## 2020-10-18 NOTE — Assessment & Plan Note (Addendum)
Stable, admits to stress eating and lack of energy Denies need for medication and psychology referral at this time

## 2020-10-18 NOTE — Patient Instructions (Addendum)
Have labs drawn at cancer center today.  Maintain heart healthy diet and daily exercise of brisk walking.  Schedule appt for repeat PAP and mammogram, have reports faxed to me.  Calorie Counting for Weight Loss Calories are units of energy. Your body needs a certain number of calories from food to keep going throughout the day. When you eat or drink more calories than your body needs, your body stores the extra calories mostly as fat. When you eat or drink fewer calories than your body needs, your body burns fat to get the energy it needs. Calorie counting means keeping track of how many calories you eat and drink each day. Calorie counting can be helpful if you need to lose weight. If you eat fewer calories than your body needs, you should lose weight. Ask your health care provider what a healthy weight is for you. For calorie counting to work, you will need to eat the right number of calories each day to lose a healthy amount of weight per week. A dietitian can help you figure out how many calories you need in a day and will suggest ways to reach your calorie goal.  A healthy amount of weight to lose each week is usually 1-2 lb (0.5-0.9 kg). This usually means that your daily calorie intake should be reduced by 500-750 calories.  Eating 1,200-1,500 calories a day can help most women lose weight.  Eating 1,500-1,800 calories a day can help most men lose weight. What do I need to know about calorie counting? Work with your health care provider or dietitian to determine how many calories you should get each day. To meet your daily calorie goal, you will need to:  Find out how many calories are in each food that you would like to eat. Try to do this before you eat.  Decide how much of the food you plan to eat.  Keep a food log. Do this by writing down what you ate and how many calories it had. To successfully lose weight, it is important to balance calorie counting with a healthy  lifestyle that includes regular activity. Where do I find calorie information? The number of calories in a food can be found on a Nutrition Facts label. If a food does not have a Nutrition Facts label, try to look up the calories online or ask your dietitian for help. Remember that calories are listed per serving. If you choose to have more than one serving of a food, you will have to multiply the calories per serving by the number of servings you plan to eat. For example, the label on a package of bread might say that a serving size is 1 slice and that there are 90 calories in a serving. If you eat 1 slice, you will have eaten 90 calories. If you eat 2 slices, you will have eaten 180 calories.   How do I keep a food log? After each time that you eat, record the following in your food log as soon as possible:  What you ate. Be sure to include toppings, sauces, and other extras on the food.  How much you ate. This can be measured in cups, ounces, or number of items.  How many calories were in each food and drink.  The total number of calories in the food you ate. Keep your food log near you, such as in a pocket-sized notebook or on an app or website on your mobile phone. Some programs will calculate  calories for you and show you how many calories you have left to meet your daily goal. What are some portion-control tips?  Know how many calories are in a serving. This will help you know how many servings you can have of a certain food.  Use a measuring cup to measure serving sizes. You could also try weighing out portions on a kitchen scale. With time, you will be able to estimate serving sizes for some foods.  Take time to put servings of different foods on your favorite plates or in your favorite bowls and cups so you know what a serving looks like.  Try not to eat straight from a food's packaging, such as from a bag or box. Eating straight from the package makes it hard to see how much you are  eating and can lead to overeating. Put the amount you would like to eat in a cup or on a plate to make sure you are eating the right portion.  Use smaller plates, glasses, and bowls for smaller portions and to prevent overeating.  Try not to multitask. For example, avoid watching TV or using your computer while eating. If it is time to eat, sit down at a table and enjoy your food. This will help you recognize when you are full. It will also help you be more mindful of what and how much you are eating. What are tips for following this plan? Reading food labels  Check the calorie count compared with the serving size. The serving size may be smaller than what you are used to eating.  Check the source of the calories. Try to choose foods that are high in protein, fiber, and vitamins, and low in saturated fat, trans fat, and sodium. Shopping  Read nutrition labels while you shop. This will help you make healthy decisions about which foods to buy.  Pay attention to nutrition labels for low-fat or fat-free foods. These foods sometimes have the same number of calories or more calories than the full-fat versions. They also often have added sugar, starch, or salt to make up for flavor that was removed with the fat.  Make a grocery list of lower-calorie foods and stick to it. Cooking  Try to cook your favorite foods in a healthier way. For example, try baking instead of frying.  Use low-fat dairy products. Meal planning  Use more fruits and vegetables. One-half of your plate should be fruits and vegetables.  Include lean proteins, such as chicken, [place], and fish. Lifestyle Each week, aim to do one of the following:  150 minutes of moderate exercise, such as walking.  75 minutes of vigorous exercise, such as running. General information  Know how many calories are in the foods you eat most often. This will help you calculate calorie counts faster.  Find a way of tracking calories that  works for you. Get creative. Try different apps or programs if writing down calories does not work for you. What foods should I eat?  Eat nutritious foods. It is better to have a nutritious, high-calorie food, such as an avocado, than a food with few nutrients, such as a bag of potato chips.  Use your calories on foods and drinks that will fill you up and will not leave you hungry soon after eating. ? Examples of foods that fill you up are nuts and nut butters, vegetables, lean proteins, and high-fiber foods such as whole grains. High-fiber foods are foods with more than 5 g of fiber  per serving.  Pay attention to calories in drinks. Low-calorie drinks include water and unsweetened drinks. The items listed above may not be a complete list of foods and beverages you can eat. Contact a dietitian for more information.   What foods should I limit? Limit foods or drinks that are not good sources of vitamins, minerals, or protein or that are high in unhealthy fats. These include:  Candy.  Other sweets.  Sodas, specialty coffee drinks, alcohol, and juice. The items listed above may not be a complete list of foods and beverages you should avoid. Contact a dietitian for more information. How do I count calories when eating out?  Pay attention to portions. Often, portions are much larger when eating out. Try these tips to keep portions smaller: ? Consider sharing a meal instead of getting your own. ? If you get your own meal, eat only half of it. Before you start eating, ask for a container and put half of your meal into it. ? When available, consider ordering smaller portions from the menu instead of full portions.  Pay attention to your food and drink choices. Knowing the way food is cooked and what is included with the meal can help you eat fewer calories. ? If calories are listed on the menu, choose the lower-calorie options. ? Choose dishes that include vegetables, fruits, whole grains,  low-fat dairy products, and lean proteins. ? Choose items that are boiled, broiled, grilled, or steamed. Avoid items that are buttered, battered, fried, or served with cream sauce. Items labeled as crispy are usually fried, unless stated otherwise. ? Choose water, low-fat milk, unsweetened iced tea, or other drinks without added sugar. If you want an alcoholic beverage, choose a lower-calorie option, such as a glass of wine or light beer. ? Ask for dressings, sauces, and syrups on the side. These are usually high in calories, so you should limit the amount you eat. ? If you want a salad, choose a garden salad and ask for grilled meats. Avoid extra toppings such as bacon, cheese, or fried items. Ask for the dressing on the side, or ask for olive oil and vinegar or lemon to use as dressing.  Estimate how many servings of a food you are given. Knowing serving sizes will help you be aware of how much food you are eating at restaurants. Where to find more information  Centers for Disease Control and Prevention: FootballExhibition.com.br  [place] Department of Agriculture: WrestlingReporter.dk Summary  Calorie counting means keeping track of how many calories you eat and drink each day. If you eat fewer calories than your body needs, you should lose weight.  A healthy amount of weight to lose per week is usually 1-2 lb (0.5-0.9 kg). This usually means reducing your daily calorie intake by 500-750 calories.  The number of calories in a food can be found on a Nutrition Facts label. If a food does not have a Nutrition Facts label, try to look up the calories online or ask your dietitian for help.  Use smaller plates, glasses, and bowls for smaller portions and to prevent overeating.  Use your calories on foods and drinks that will fill you up and not leave you hungry shortly after a meal. This information is not intended to replace advice given to you by your health care provider. Make sure you discuss any questions you have  with your health care provider. Document Revised: 08/24/2019 Document Reviewed: 08/24/2019 Elsevier Patient Education  2021 [employer].  Exercising to  Stay Healthy To become healthy and stay healthy, it is recommended that you do moderate-intensity and vigorous-intensity exercise. You can tell that you are exercising at a moderate intensity if your heart starts beating faster and you start breathing faster but can still hold a conversation. You can tell that you are exercising at a vigorous intensity if you are breathing much harder and faster and cannot hold a conversation while exercising. Exercising regularly is important. It has many health benefits, such as:  Improving overall fitness, flexibility, and endurance.  Increasing bone density.  Helping with weight control.  Decreasing body fat.  Increasing muscle strength.  Reducing stress and tension.  Improving overall health. How often should I exercise? Choose an activity that you enjoy, and set realistic goals. Your health care provider can help you make an activity plan that works for you. Exercise regularly as told by your health care provider. This may include:  Doing strength training two times a week, such as: ? Lifting weights. ? Using resistance bands. ? Push-ups. ? Sit-ups. ? Yoga.  Doing a certain intensity of exercise for a given amount of time. Choose from these options: ? A total of 150 minutes of moderate-intensity exercise every week. ? A total of 75 minutes of vigorous-intensity exercise every week. ? A mix of moderate-intensity and vigorous-intensity exercise every week. Children, pregnant women, people who have not exercised regularly, people who are overweight, and older adults may need to talk with a health care provider about what activities are safe to do. If you have a medical condition, be sure to talk with your health care provider before you start a new exercise program. What are some exercise  ideas? Moderate-intensity exercise ideas include:  Walking 1 mile (1.6 km) in about 15 minutes.  Biking.  Hiking.  Golfing.  Dancing.  Water aerobics. Vigorous-intensity exercise ideas include:  Walking 4.5 miles (7.2 km) or more in about 1 hour.  Jogging or running 5 miles (8 km) in about 1 hour.  Biking 10 miles (16.1 km) or more in about 1 hour.  Lap swimming.  Roller-skating or in-line skating.  Cross-country skiing.  Vigorous competitive sports, such as football, basketball, and soccer.  Jumping rope.  Aerobic dancing.   What are some everyday activities that can help me to get exercise?  Yard work, such as: ? Pushing a Surveyor, mining. ? Raking and bagging leaves.  Washing your car.  Pushing a stroller.  Shoveling snow.  Gardening.  Washing windows or floors. How can I be more active in my day-to-day activities?  Use stairs instead of an elevator.  Take a walk during your lunch break.  If you drive, park your car farther away from your work or school.  If you take public transportation, get off one stop early and walk the rest of the way.  Stand up or walk around during all of your indoor phone calls.  Get up, stretch, and walk around every 30 minutes throughout the day.  Enjoy exercise with a friend. Support to continue exercising will help you keep a regular routine of activity. What guidelines can I follow while exercising?  Before you start a new exercise program, talk with your health care provider.  Do not exercise so much that you hurt yourself, feel dizzy, or get very short of breath.  Wear comfortable clothes and wear shoes with good support.  Drink plenty of water while you exercise to prevent dehydration or heat stroke.  Work out until your breathing  and your heartbeat get faster. Where to find more information  [place] Department of Health and Human Services: ThisPath.fi  Centers for Disease Control and Prevention (CDC):  FootballExhibition.com.br Summary  Exercising regularly is important. It will improve your overall fitness, flexibility, and endurance.  Regular exercise also will improve your overall health. It can help you control your weight, reduce stress, and improve your bone density.  Do not exercise so much that you hurt yourself, feel dizzy, or get very short of breath.  Before you start a new exercise program, talk with your health care provider. This information is not intended to replace advice given to you by your health care provider. Make sure you discuss any questions you have with your health care provider. Document Revised: 06/25/2017 Document Reviewed: 06/03/2017 Elsevier Patient Education  2021 [employer].

## 2020-10-18 NOTE — Assessment & Plan Note (Addendum)
Started [employer] and supplement 1week ago No exercise regimen Admits to stress eating and high calorie diet. Not interested in use of metformin or wellbutrin or saxenda injection. Advised to Maintain heart healthy diet and snacks, decrease meal portions, and daily exercise of brisk walking. F/up in 12months

## 2020-10-18 NOTE — Assessment & Plan Note (Signed)
Diagnosed at age 40 per patient No previous thyroid US or medication. Repeat thyroid panel

## 2020-10-18 NOTE — Progress Notes (Signed)
Subjective:    Patient ID: Brittney Lester, female    DOB: 1981-05-19, 40 y.o.   MRN: [id number]  Patient presents today for CPE and eval of chronic conditions  HPI Vitamin D deficiency Repeat Vit. D Continue 5000IU daily  Depression, major, single episode, in partial remission (HCC) Stable, admits to stress eating and lack of energy Denies need for medication and psychology referral at this time  Class 3 severe obesity due to excess calories with serious comorbidity and body mass index (BMI) of 50.0 to 59.9 in adult (HCC) Started golo diet and supplement 1week ago No exercise regimen Admits to stress eating and high calorie diet. Not interested in use of metformin or wellbutrin or saxenda injection. Advised to Maintain heart healthy diet and snacks, decrease meal portions, and daily exercise of brisk walking. F/up in 71months  Hx of Hashimoto thyroiditis Diagnosed at age 57 per patient No previous thyroid US or medication. Repeat thyroid panel  Hyperglycemia Repeat hgbA1c  appt with hematology 10/18/20 for re eval of PE and repeat CT chest.  Sexual History (orientation,birth control, marital status, STD):deferred breast and pelvic exam to GYN per patient, denies need for STD screen  Depression/Suicide: Depression screen [facility] 2/9 10/18/2020 07/15/2020 02/21/2020  Decreased Interest 1 0 2  Down, Depressed, Hopeless 1 0 3  PHQ - 2 Score 2 0 5  Altered sleeping 0 - 1  Tired, decreased energy 1 - 3  Change in appetite 1 - 3  Feeling bad or failure about yourself  0 - 1  Trouble concentrating 0 - 0  Moving slowly or fidgety/restless 0 - 1  Suicidal thoughts 0 - 0  PHQ-9 Score 4 - 14  Difficult doing work/chores Somewhat difficult - -   GAD 7 : Generalized Anxiety Score 10/18/2020  Nervous, Anxious, on Edge 0  Control/stop worrying 0  Worry too much - different things 0  Trouble relaxing 1  Restless 0  Easily annoyed or irritable 1  Afraid - awful might happen 0   Total GAD 7 Score 2  Anxiety Difficulty Somewhat difficult   Vision:not needed  Dental:up to date  Immunizations: (TDAP, Hep C screen, Pneumovax, Influenza, zoster)  Health Maintenance  Topic Date Due  .  Hepatitis C: One time screening is recommended by Center for Disease Control  (CDC) for  adults born from [birth year] through 1965.   Never done  . Pneumococcal vaccine  Never done  . Complete foot exam   Never done  . Eye exam for diabetics  Never done  . Urine Protein Check  Never done  . Tetanus Vaccine  Never done  . Pap Smear  03/14/2019  . Hemoglobin A1C  08/23/2020  . COVID-19 Vaccine (3 - Booster for Pfizer series) 02/17/2021  . Flu Shot  Completed  . HIV Screening  Completed  . HPV Vaccine  Aged Out   Weight:  Wt Readings from Last 3 Encounters:  10/18/20 (!) 310 lb 9.6 oz (140.9 kg)  08/20/20 (!) 303 lb (137.4 kg)  07/15/20 (!) 304 lb 3.2 oz (138 kg)    Fall Risk: Fall Risk  10/18/2020 07/15/2020  Falls in the past year? 0 0  Number falls in past yr: 0 -  Injury with Fall? 0 -  Risk for fall due to : No Fall Risks -  Follow up Falls evaluation completed -   Medications and allergies reviewed with patient and updated if appropriate.  Patient Active Problem List   Diagnosis Date  Noted  . Hyperglycemia 10/18/2020  . Depression, major, single episode, in partial remission (HCC) 10/18/2020  . Hx of Hashimoto thyroiditis 10/18/2020  . Vitamin D deficiency 10/18/2020  . Class 3 severe obesity due to excess calories with serious comorbidity and body mass index (BMI) of 50.0 to 59.9 in adult [facility](HCC) 10/18/2020  . Multiple subsegmental pulmonary emboli without acute cor pulmonale (HCC) 05/01/2020    Current Outpatient Medications on File Prior to Visit  Medication Sig Dispense Refill  . acetaminophen (TYLENOL) 500 MG tablet Take 1,000 mg by mouth every 6 (six) hours as needed for headache (pain).    Marland Kitchen. albuterol (VENTOLIN HFA) 108 (90 Base) MCG/ACT inhaler Inhale 1 puff  into the lungs every 4 (four) hours as needed for wheezing or shortness of breath.     Marland Kitchen. apixaban (ELIQUIS) 5 MG TABS tablet Take 1 tablet (5 mg total) by mouth 2 (two) times daily. 120 tablet 0  . Ascorbic Acid (VITAMIN C PO) Take 1 tablet by mouth at bedtime.    . Cholecalciferol (VITAMIN D) 125 MCG (5000 UT) CAPS Take 1 capsule by mouth daily at 12 noon. 30 capsule   . Zinc 50 MG TABS Take 50 mg by mouth at bedtime.     No current facility-administered medications on file prior to visit.    Past Medical History:  Diagnosis Date  . Acute respiratory failure with hypoxia (HCC) 04/13/2020  . Anxiety   . COVID-19   . COVID-19 virus infection 04/13/2020  . Depression   . Depression   . Diabetes mellitus without complication (HCC)    gestational  . Excessive and frequent menstruation 07/22/2016  . Family history of adverse reaction to anesthesia    mother has nausea post surgery  . Fatigue   . Graves disease    no longer need medication treatment  . Graves disease   . HPV in female   . Joint pain   . Multifocal pneumonia   . Obesity   . Palpitations   . Prediabetes   . Swelling     Past Surgical History:  Procedure Laterality Date  . COLPOSCOPY    . DILITATION & CURRETTAGE/HYSTROSCOPY WITH HYDROTHERMAL ABLATION N/A 07/22/2016   Procedure: DILATATION & CURETTAGE/HYSTEROSCOPY WITH HYDROTHERMAL ABLATION;  Surgeon: Gerald Leitzara Cole, MD;  Location: [facility];  Service: Gynecology;  Laterality: N/A;  . LAPAROSCOPIC TUBAL LIGATION Bilateral 07/22/2016   Procedure: LAPAROSCOPIC TUBAL LIGATION;  Surgeon: Gerald Leitzara Cole, MD;  Location: [facility];  Service: Gynecology;  Laterality: Bilateral;  . TONSILLECTOMY      Social History   Socioeconomic History  . Marital status: Divorced    Spouse name: Not on file  . Number of children: 3  . Years of education: Not on file  . Highest education level: Not on file  Occupational History  . Occupation: Doctor, general practicepeech Pathologist  Tobacco Use  . Smoking status:  Never Smoker  . Smokeless tobacco: Never Used  Vaping Use  . Vaping Use: Never used  Substance and Sexual Activity  . Alcohol use: Yes    Comment: occ  . Drug use: No  . Sexual activity: Not on file  Other Topics Concern  . Not on file  Social History Narrative  . Not on file   Social Determinants of Health   Financial Resource Strain: Not on file  Food Insecurity: Not on file  Transportation Needs: Not on file  Physical Activity: Not on file  Stress: Not on file  Social Connections: Not on file  Family History  Problem Relation Age of Onset  . Hypertension Other   . Hypertension Mother   . Thyroid disease Mother   . High Cholesterol Mother   . Obesity Mother   . Heart disease Maternal Grandfather   . High blood pressure Father   . High Cholesterol Father   . Obesity Father         Review of Systems  Constitutional: Negative for fever, malaise/fatigue and weight loss.  HENT: Negative for congestion and sore throat.   Eyes:       Negative for visual changes  Respiratory: Negative for cough and shortness of breath.   Cardiovascular: Negative for chest pain, palpitations and leg swelling.  Gastrointestinal: Negative for blood in stool, constipation, diarrhea and heartburn.  Genitourinary: Negative for dysuria, frequency and urgency.  Musculoskeletal: Negative for falls, joint pain and myalgias.  Skin: Negative for rash.  Neurological: Negative for dizziness, sensory change and headaches.  Endo/Heme/Allergies: Does not bruise/bleed easily.  Psychiatric/Behavioral: Negative for depression, substance abuse and suicidal ideas. The patient is not nervous/anxious.     Objective:   Vitals:   10/18/20 0916  BP: 130/82  Pulse: 93  Temp: (!) 97.3 F (36.3 C)  SpO2: 98%    Body mass index is 50.51 kg/m.   Physical Examination:  Physical Exam Vitals reviewed.  Constitutional:      General: She is not in acute distress.    Appearance: She is  well-developed. She is obese.  HENT:     Right Ear: Tympanic membrane, ear canal and external ear normal.     Left Ear: Tympanic membrane, ear canal and external ear normal.     Mouth/Throat:     Pharynx: No oropharyngeal exudate.  Eyes:     Extraocular Movements: Extraocular movements intact.     Conjunctiva/sclera: Conjunctivae normal.  Cardiovascular:     Rate and Rhythm: Normal rate and regular rhythm.     Pulses: Normal pulses.     Heart sounds: Normal heart sounds.  Pulmonary:     Effort: Pulmonary effort is normal. No respiratory distress.     Breath sounds: Normal breath sounds.  Chest:     Chest wall: No tenderness.  Abdominal:     General: Bowel sounds are normal.     Palpations: Abdomen is soft.  Genitourinary:    Comments: Deferred breast and pelvic exam to GYN Musculoskeletal:        General: Normal range of motion.     Cervical back: Normal range of motion and neck supple.     Right lower leg: No edema.     Left lower leg: No edema.  Skin:    General: Skin is warm and dry.  Neurological:     Mental Status: She is alert and oriented to person, place, and time.     Deep Tendon Reflexes: Reflexes are normal and symmetric.  Psychiatric:        Mood and Affect: Mood normal.        Behavior: Behavior normal.        Thought Content: Thought content normal.    ASSESSMENT and PLAN: This visit occurred during the SARS-CoV-2 public health emergency.  Safety protocols were in place, including screening questions prior to the visit, additional usage of staff PPE, and extensive cleaning of exam room while observing appropriate contact time as indicated for disinfecting solutions.   Mailynn was seen today for annual exam.  Diagnoses and all orders for this visit:  Encounter for  preventative adult health care exam with abnormal findings  Hyperglycemia -     Hemoglobin A1c; Future  Encounter for lipid screening for cardiovascular disease -     Lipid panel;  Future  Class 3 severe obesity due to excess calories with serious comorbidity and body mass index (BMI) of 50.0 to 59.9 in adult [facility]) -     Lipid panel; Future -     Hemoglobin A1c; Future  Chronic fatigue -     Thyroid Panel With TSH; Future -     B12; Future  Vitamin D deficiency -     Vitamin D 1,25 dihydroxy; Future  Hx of Hashimoto thyroiditis -     Thyroid Panel With TSH; Future  Depression, major, single episode, in partial remission (HCC)        Problem List Items Addressed This Visit      Other   Class 3 severe obesity due to excess calories with serious comorbidity and body mass index (BMI) of 50.0 to 59.9 in adult (HCC)    Started golo diet and supplement 1week ago No exercise regimen Admits to stress eating and high calorie diet. Not interested in use of metformin or wellbutrin or saxenda injection. Advised to Maintain heart healthy diet and snacks, decrease meal portions, and daily exercise of brisk walking. F/up in 44months      Relevant Orders   Lipid panel   Hemoglobin A1c   Depression, major, single episode, in partial remission (HCC)    Stable, admits to stress eating and lack of energy Denies need for medication and psychology referral at this time      Hx of Hashimoto thyroiditis    Diagnosed at age 55 per patient No previous thyroid US or medication. Repeat thyroid panel      Relevant Orders   Thyroid Panel With TSH   Hyperglycemia    Repeat hgbA1c      Relevant Orders   Hemoglobin A1c   Vitamin D deficiency    Repeat Vit. D Continue 5000IU daily      Relevant Orders   Vitamin D 1,25 dihydroxy    Other Visit Diagnoses    Encounter for preventative adult health care exam with abnormal findings    -  Primary   Encounter for lipid screening for cardiovascular disease       Relevant Orders   Lipid panel   Chronic fatigue       Relevant Orders   Thyroid Panel With TSH   B12      Follow up: Return in about 3 months  (around 01/18/2021) for Hyperglycemia and weight management.  [name], NP

## 2020-10-18 NOTE — Assessment & Plan Note (Signed)
Repeat hgbA1c 

## 2020-10-18 NOTE — Assessment & Plan Note (Signed)
Repeat Vit. D Continue 5000IU daily

## 2020-10-21 ENCOUNTER — Telehealth: Payer: Self-pay | Admitting: *Deleted

## 2020-10-21 NOTE — Telephone Encounter (Signed)
Per los 10/18/20 Called patient and gave upcoming appointments

## 2020-10-25 ENCOUNTER — Ambulatory Visit (HOSPITAL_BASED_OUTPATIENT_CLINIC_OR_DEPARTMENT_OTHER)
Admission: RE | Admit: 2020-10-25 | Discharge: 2020-10-25 | Disposition: A | Discharge status: Home / Self Care | Payer: BC Managed Care – PPO | Source: Ambulatory Visit | Attending: Nurse Practitioner | Admitting: Nurse Practitioner

## 2020-10-25 ENCOUNTER — Other Ambulatory Visit: Payer: Self-pay

## 2020-10-25 ENCOUNTER — Ambulatory Visit (HOSPITAL_BASED_OUTPATIENT_CLINIC_OR_DEPARTMENT_OTHER): Payer: BC Managed Care – PPO

## 2020-10-25 DIAGNOSIS — Z1231 Encounter for screening mammogram for malignant neoplasm of breast: Secondary | ICD-10-CM | POA: Diagnosis not present

## 2020-12-24 ENCOUNTER — Other Ambulatory Visit: Payer: Self-pay | Admitting: Family

## 2020-12-24 DIAGNOSIS — I2694 Multiple subsegmental pulmonary emboli without acute cor pulmonale: Secondary | ICD-10-CM

## 2021-01-15 ENCOUNTER — Telehealth: Payer: Self-pay | Admitting: Nurse Practitioner

## 2021-01-15 NOTE — Telephone Encounter (Signed)
Pt called and wanted to know if she needs to come in for fasting labs tomorrow before her appointment on Friday or if she should just come in fasting Friday. She said she wasn't able to get all the labs done from before that you requested be done.

## 2021-01-15 NOTE — Telephone Encounter (Signed)
Patient advised to come in for fasting labs the same day as her appointment in 2 days. Since its close to that date and she has an early appointment slot.

## 2021-01-17 ENCOUNTER — Ambulatory Visit: Payer: BC Managed Care – PPO | Admitting: Nurse Practitioner

## 2021-01-17 ENCOUNTER — Encounter: Payer: Self-pay | Admitting: Nurse Practitioner

## 2021-01-17 ENCOUNTER — Other Ambulatory Visit: Payer: Self-pay

## 2021-01-17 VITALS — BP 120/88 | HR 91 | Temp 97.2°F | Ht 65.0 in | Wt 298.8 lb

## 2021-01-17 DIAGNOSIS — E559 Vitamin D deficiency, unspecified: Secondary | ICD-10-CM

## 2021-01-17 DIAGNOSIS — Z6841 Body Mass Index (BMI) 40.0 and over, adult: Secondary | ICD-10-CM | POA: Diagnosis not present

## 2021-01-17 DIAGNOSIS — R5382 Chronic fatigue, unspecified: Secondary | ICD-10-CM | POA: Diagnosis not present

## 2021-01-17 DIAGNOSIS — Z8639 Personal history of other endocrine, nutritional and metabolic disease: Secondary | ICD-10-CM

## 2021-01-17 DIAGNOSIS — R739 Hyperglycemia, unspecified: Secondary | ICD-10-CM | POA: Diagnosis not present

## 2021-01-17 LAB — VITAMIN B12: Vitamin B-12: 343 pg/mL (ref 211–911)

## 2021-01-17 LAB — LIPID PANEL
Cholesterol: 162 mg/dL (ref 0–200)
HDL: 42.9 mg/dL (ref >39.00)
LDL Cholesterol: 87 mg/dL (ref 0–99)
NonHDL: 118.66
Total CHOL/HDL Ratio: 4
Triglycerides: 160 mg/dL — ABNORMAL HIGH (ref 0.0–149.0)
VLDL: 32 mg/dL (ref 0.0–40.0)

## 2021-01-17 LAB — HEMOGLOBIN A1C: Hgb A1c MFr Bld: 5 % (ref 4.6–6.5)

## 2021-01-17 NOTE — Progress Notes (Signed)
Subjective:  Patient ID: Brittney Lester, female    DOB: 08-Jun-1981  Age: 40 y.o. MRN: [id number]  CC: Follow-up (Pt here for 3 month f/u hyperglycemia, pt fasting for lab work.  Pt has no concerns today.)  HPI  Class 3 severe obesity due to excess calories with serious comorbidity and body mass index (BMI) of 50.0 to 59.9 in adult [facility]) Lost 12lbs in last 5months through low fat/low carb diet and exercise Encourage to maintain changes. Check hgbA1c, TSH and lipid panel Wt Readings from Last 3 Encounters:  01/17/21 298 lb 12.8 oz (135.5 kg)  10/18/20 (!) 310 lb (140.6 kg)  10/18/20 (!) 310 lb 9.6 oz (140.9 kg)    BP Readings from Last 3 Encounters:  01/17/21 120/88  10/18/20 132/86  10/18/20 130/82    Reviewed past Medical, Social and Family history today.  Outpatient Medications Prior to Visit  Medication Sig Dispense Refill   acetaminophen (TYLENOL) 500 MG tablet Take 1,000 mg by mouth every 6 (six) hours as needed for headache (pain).     albuterol (VENTOLIN HFA) 108 (90 Base) MCG/ACT inhaler Inhale 1 puff into the lungs every 4 (four) hours as needed for wheezing or shortness of breath.      Ascorbic Acid (VITAMIN C PO) Take 1 tablet by mouth at bedtime.     Cholecalciferol (VITAMIN D) 125 MCG (5000 UT) CAPS Take 1 capsule by mouth daily at 12 noon. 30 capsule    COVID-19 mRNA vaccine, Pfizer, 30 MCG/0.3ML injection INJECT AS DIRECTED .3 mL 0   ELIQUIS 5 MG TABS tablet TAKE 1 TABLET BY MOUTH TWICE A DAY 120 tablet 0   Zinc 50 MG TABS Take 50 mg by mouth at bedtime.     No facility-administered medications prior to visit.   ROS See HPI  Objective:  BP 120/88 (BP Location: Left Arm, Patient Position: Sitting, Cuff Size: Large)   Pulse 91   Temp (!) 97.2 F (36.2 C) (Temporal)   Ht 5\' 5"  (1.651 m)   Wt 298 lb 12.8 oz (135.5 kg)   SpO2 98%   BMI 49.72 kg/m   Physical Exam  Assessment & Plan:  This visit occurred during the SARS-CoV-2 public health emergency.   Safety protocols were in place, including screening questions prior to the visit, additional usage of staff PPE, and extensive cleaning of exam room while observing appropriate contact time as indicated for disinfecting solutions.   Brittney Lester was seen today for follow-up.  Diagnoses and all orders for this visit:  Hyperglycemia -     Hemoglobin A1c  Vitamin D deficiency -     Vitamin D 1,25 dihydroxy  Hx of Hashimoto thyroiditis -     Thyroid Panel With TSH  Class 3 severe obesity due to excess calories with serious comorbidity and body mass index (BMI) of 50.0 to 59.9 in adult [facility]) -     Lipid panel  Chronic fatigue -     B12  Problem List Items Addressed This Visit       Other   Class 3 severe obesity due to excess calories with serious comorbidity and body mass index (BMI) of 50.0 to 59.9 in adult [facility])    Lost 12lbs in last 4242months through low fat/low carb diet and exercise Encourage to maintain changes. Check hgbA1c, TSH and lipid panel       Relevant Orders   Lipid panel (Completed)   Hx of Hashimoto thyroiditis   Relevant Orders   Thyroid  Panel With TSH   Hyperglycemia - Primary   Relevant Orders   Hemoglobin A1c (Completed)   Vitamin D deficiency   Relevant Orders   Vitamin D 1,25 dihydroxy   Other Visit Diagnoses     Chronic fatigue       Relevant Orders   B12 (Completed)       Follow-up: Return in about 3 months (around 04/19/2021) for weight management ( ).  Brittney Penna, NP

## 2021-01-17 NOTE — Patient Instructions (Signed)
Great job with weight loss. Keep up the good work.  Go to lab for blood draw.

## 2021-01-17 NOTE — Assessment & Plan Note (Signed)
Lost 12lbs in last 67months through low fat/low carb diet and exercise Encourage to maintain changes. Check hgbA1c, TSH and lipid panel

## 2021-01-18 LAB — THYROID PANEL WITH TSH
Free Thyroxine Index: 2.7 (ref 1.4–3.8)
T3 Uptake: 31 % (ref 22–35)
T4, Total: 8.8 ug/dL (ref 5.1–11.9)
TSH: 1.08 mIU/L

## 2021-01-21 ENCOUNTER — Encounter: Payer: Self-pay | Admitting: Nurse Practitioner

## 2021-01-29 ENCOUNTER — Encounter: Payer: Self-pay | Admitting: Family

## 2021-04-07 ENCOUNTER — Telehealth: Payer: Self-pay

## 2021-04-18 ENCOUNTER — Ambulatory Visit: Payer: BC Managed Care – PPO | Admitting: Family

## 2021-04-18 ENCOUNTER — Other Ambulatory Visit: Payer: BC Managed Care – PPO

## 2021-04-21 ENCOUNTER — Ambulatory Visit: Payer: BC Managed Care – PPO | Admitting: Nurse Practitioner

## 2021-06-03 ENCOUNTER — Other Ambulatory Visit: Payer: Self-pay

## 2021-06-03 ENCOUNTER — Inpatient Hospital Stay: Payer: BC Managed Care – PPO | Attending: Family

## 2021-06-03 ENCOUNTER — Encounter: Payer: Self-pay | Admitting: Family

## 2021-06-03 ENCOUNTER — Inpatient Hospital Stay (HOSPITAL_BASED_OUTPATIENT_CLINIC_OR_DEPARTMENT_OTHER): Payer: BC Managed Care – PPO | Admitting: Family

## 2021-06-03 VITALS — BP 125/76 | HR 79 | Temp 98.4°F | Resp 19 | Ht 64.0 in | Wt 292.0 lb

## 2021-06-03 DIAGNOSIS — Z79899 Other long term (current) drug therapy: Secondary | ICD-10-CM | POA: Diagnosis not present

## 2021-06-03 DIAGNOSIS — I2694 Multiple subsegmental pulmonary emboli without acute cor pulmonale: Secondary | ICD-10-CM | POA: Diagnosis not present

## 2021-06-03 DIAGNOSIS — Z8616 Personal history of COVID-19: Secondary | ICD-10-CM | POA: Diagnosis not present

## 2021-06-03 DIAGNOSIS — D6859 Other primary thrombophilia: Secondary | ICD-10-CM

## 2021-06-03 DIAGNOSIS — Z7901 Long term (current) use of anticoagulants: Secondary | ICD-10-CM | POA: Diagnosis not present

## 2021-06-03 LAB — CBC WITH DIFFERENTIAL (CANCER CENTER ONLY)
Abs Immature Granulocytes: 0.04 10*3/uL (ref 0.00–0.07)
Basophils Absolute: 0 10*3/uL (ref 0.0–0.1)
Basophils Relative: 0 %
Eosinophils Absolute: 0.1 10*3/uL (ref 0.0–0.5)
Eosinophils Relative: 1 %
HCT: 41.2 % (ref 36.0–46.0)
Hemoglobin: 13.9 g/dL (ref 12.0–15.0)
Immature Granulocytes: 1 %
Lymphocytes Relative: 20 %
Lymphs Abs: 1.6 10*3/uL (ref 0.7–4.0)
MCH: 30.5 pg (ref 26.0–34.0)
MCHC: 33.7 g/dL (ref 30.0–36.0)
MCV: 90.4 fL (ref 80.0–100.0)
Monocytes Absolute: 0.7 10*3/uL (ref 0.1–1.0)
Monocytes Relative: 8 %
Neutro Abs: 5.8 10*3/uL (ref 1.7–7.7)
Neutrophils Relative %: 70 %
Platelet Count: 177 10*3/uL (ref 150–400)
RBC: 4.56 MIL/uL (ref 3.87–5.11)
RDW: 13.4 % (ref 11.5–15.5)
WBC Count: 8.3 10*3/uL (ref 4.0–10.5)
nRBC: 0 % (ref 0.0–0.2)

## 2021-06-03 LAB — CMP (CANCER CENTER ONLY)
ALT: 17 U/L (ref 0–44)
AST: 16 U/L (ref 15–41)
Albumin: 4.5 g/dL (ref 3.5–5.0)
Alkaline Phosphatase: 57 U/L (ref 38–126)
Anion gap: 8 (ref 5–15)
BUN: 18 mg/dL (ref 6–20)
CO2: 27 mmol/L (ref 22–32)
Calcium: 9.7 mg/dL (ref 8.9–10.3)
Chloride: 103 mmol/L (ref 98–111)
Creatinine: 1 mg/dL (ref 0.44–1.00)
GFR, Estimated: >60 mL/min (ref >60)
Glucose, Bld: 94 mg/dL (ref 70–99)
Potassium: 4 mmol/L (ref 3.5–5.1)
Sodium: 138 mmol/L (ref 135–145)
Total Bilirubin: 1.2 mg/dL (ref 0.3–1.2)
Total Protein: 7.1 g/dL (ref 6.5–8.1)

## 2021-06-03 LAB — LACTATE DEHYDROGENASE: LDH: 165 U/L (ref 98–192)

## 2021-06-03 LAB — D-DIMER, QUANTITATIVE: D-Dimer, Quant: <0.27 ug/mL-FEU (ref 0.00–0.50)

## 2021-06-03 MED ORDER — APIXABAN 2.5 MG PO TABS: 2.5000 mg | ORAL_TABLET | Freq: Two times a day (BID) | ORAL | 5 refills | Status: DC

## 2021-06-03 NOTE — Progress Notes (Signed)
Hematology and Oncology Follow Up Visit  Brittney Lester [id number] 08-30-1980 40 y.o. 06/03/2021   Principle Diagnosis:  Multifocal subsegmental pulmonary emboli without acute cor pulmonale    Current Therapy:        Eliquis 5 mg PO BID - reduced to 2.5 mg PO BID 06/03/2021   Interim History:  Brittney Lester is here today for follow-up. She is doing quite well and has no complaints at this time.  She has tolerated full dose Eliquis nicely and will now transition to maintenance 2.5 mg PO BID.  She has occasional vaginal spotting since ablation around the time her cycle is due. No other blood loss noted. No abnormal bruising or petechiae.  No fever, chills, n/v, cough, rash, SOB, chest pain, palpitations, abdominal pain or changes in bowel or bladder habits.  She has occasional random episodes of dizziness that last only a few seconds.  No falls or syncope report.  No swelling, tenderness, numbness or tingling in her extremities.  She has maintained a good appetite but admits that she needs to better hydrate throughout the day. Her weight is stable 292 lbs.   ECOG Performance Status: 1 - Symptomatic but completely ambulatory  Medications:  Allergies as of 06/03/2021   No Known Allergies      Medication List        Accurate as of June 03, 2021 11:34 AM. If you have any questions, ask your nurse or doctor.          acetaminophen 500 MG tablet Commonly known as: TYLENOL Take 1,000 mg by mouth every 6 (six) hours as needed for headache (pain).   albuterol 108 (90 Base) MCG/ACT inhaler Commonly known as: VENTOLIN HFA Inhale 1 puff into the lungs every 4 (four) hours as needed for wheezing or shortness of breath.   apixaban 2.5 MG Tabs tablet Commonly known as: ELIQUIS Take 1 tablet (2.5 mg total) by mouth 2 (two) times daily. What changed:  medication strength how much to take Changed by: Lottie Dawson, NP   Pfizer-BioNTech COVID-19 Vacc 30 MCG/0.3ML injection Generic  drug: COVID-19 mRNA vaccine (Pfizer) INJECT AS DIRECTED   VITAMIN C PO Take 1 tablet by mouth at bedtime.   Vitamin D 125 MCG (5000 UT) Caps Take 1 capsule by mouth daily at 12 noon.   Zinc 50 MG Tabs Take 50 mg by mouth at bedtime.        Allergies: No Known Allergies  Past Medical History, Surgical history, Social history, and Family History were reviewed and updated.  Review of Systems: All other 10 point review of systems is negative.   Physical Exam:  height is 5\' 4"  (1.626 m) and weight is 292 lb (132.5 kg). Her oral temperature is 98.4 F (36.9 C). Her blood pressure is 125/76 and her pulse is 79. Her respiration is 19 and oxygen saturation is 100%.   Wt Readings from Last 3 Encounters:  06/03/21 292 lb (132.5 kg)  01/17/21 298 lb 12.8 oz (135.5 kg)  10/18/20 (!) 310 lb (140.6 kg)    Ocular: Sclerae unicteric, pupils equal, round and reactive to light Ear-nose-throat: Oropharynx clear, dentition fair Lymphatic: No cervical or supraclavicular adenopathy Lungs no rales or rhonchi, good excursion bilaterally Heart regular rate and rhythm, no murmur appreciated Abd soft, nontender, positive bowel sounds MSK no focal spinal tenderness, no joint edema Neuro: non-focal, well-oriented, appropriate affect Breasts: Deferred   Lab Results  Component Value Date   WBC 8.3 06/03/2021   HGB 13.9  06/03/2021   HCT 41.2 06/03/2021   MCV 90.4 06/03/2021   PLT 177 06/03/2021   Lab Results  Component Value Date   FERRITIN 363 (H) 04/16/2020   Lab Results  Component Value Date   RBC 4.56 06/03/2021   No results found for: KPAFRELGTCHN, LAMBDASER, KAPLAMBRATIO No results found for: IGGSERUM, IGA, IGMSERUM No results found for: [name], SPEI   Chemistry      Component Value Date/Time   NA 138 06/03/2021 1052   NA 140 02/21/2020 1639   K 4.0 06/03/2021 1052   CL 103 06/03/2021 1052   CO2 27 06/03/2021 1052    BUN 18 06/03/2021 1052   BUN 11 02/21/2020 1639   CREATININE 1.00 06/03/2021 1052      Component Value Date/Time   CALCIUM 9.7 06/03/2021 1052   ALKPHOS 57 06/03/2021 1052   AST 16 06/03/2021 1052   ALT 17 06/03/2021 1052   BILITOT 1.2 06/03/2021 1052       Impression and Plan: Brittney Lester is a very pleasant 40 yo caucasian female with diagnosis of PE while having Covid. Hyper coag work up was negative.  CT angio in March 2022 showed resolution or PE.  We will now transition her to Eliquis 2.5 mg PO daily for one year.  She verbalized understanding and agreement with the medication change.  Follow-up in 6 months.  She can contact our office with any questions or concerns.   Eileen Stanford, NP 11/8/202211:34 AM

## 2021-06-04 ENCOUNTER — Telehealth: Payer: Self-pay | Admitting: *Deleted

## 2021-06-04 NOTE — Telephone Encounter (Signed)
Per 06/03/21 los - called and lvm of upcoming appointments - requested call back to confirm 

## 2021-07-15 ENCOUNTER — Ambulatory Visit: Payer: BC Managed Care – PPO | Admitting: Family Medicine

## 2021-07-15 ENCOUNTER — Other Ambulatory Visit: Payer: Self-pay

## 2021-07-15 ENCOUNTER — Encounter: Payer: Self-pay | Admitting: Family Medicine

## 2021-07-15 VITALS — BP 124/80 | HR 90 | Temp 97.0°F | Ht 64.0 in | Wt 291.6 lb

## 2021-07-15 DIAGNOSIS — E559 Vitamin D deficiency, unspecified: Secondary | ICD-10-CM

## 2021-07-15 DIAGNOSIS — R002 Palpitations: Secondary | ICD-10-CM | POA: Diagnosis not present

## 2021-07-15 LAB — VITAMIN D 25 HYDROXY (VIT D DEFICIENCY, FRACTURES): VITD: 46.48 ng/mL (ref 30.00–100.00)

## 2021-07-15 LAB — TSH: TSH: 0.87 u[IU]/mL (ref 0.35–5.50)

## 2021-07-15 LAB — T4, FREE: Free T4: 0.94 ng/dL (ref 0.60–1.60)

## 2021-07-15 NOTE — Progress Notes (Signed)
[facility] [address] [place] [address] Dept: [phone] Dept Fax: [phone]  Office Visit  Subjective:    Patient ID: Brittney Lester, female    DOB: February 03, 1981, 40 y.o..   MRN: [id number]  Chief Complaint  Patient presents with   Acute Visit    C/o having heart palpitations on/off x 1 month.  Declines flu shot.     History of Present Illness:  Patient is in today for evaluation of palpitations. Ms. Lohmann notes that for the past month, she has had a sensation that her heart is skipping beats. These episodes are associated with feeling a need to cough. She finds they do fluctuate. She had thought they might be associated with caffeine intake, but stopping caffeine had not improved these episodes. She does find when she doesn't get as much sleep the episodes occur more frequently. She notes her mother and grandmother have histories of frequent PVCs. Ms. Farnell has been working at weight loss (having lost 24 lbs so far). She is taking a probiotic, but denies using any supplements that contain stimulants. Ms. Klos has a history of provoked pulmonary emboli secondary to COVID. She is currently on Eliquis. She also has a history of prior Hashimoto's thyroiditis.  Past Medical History: Patient Active Problem List   Diagnosis Date Noted   Hyperglycemia 10/18/2020   Depression, major, single episode, in partial remission (HCC) 10/18/2020   Hx of Hashimoto thyroiditis 10/18/2020   Vitamin D deficiency 10/18/2020   Class 3 severe obesity due to excess calories with serious comorbidity and body mass index (BMI) of 50.0 to 59.9 in adult [facility]) 10/18/2020   Multiple subsegmental pulmonary emboli without acute cor pulmonale (HCC) 05/01/2020   Past Surgical History:  Procedure Laterality Date   COLPOSCOPY     DILITATION & CURRETTAGE/HYSTROSCOPY WITH HYDROTHERMAL ABLATION N/A 07/22/2016   Procedure: DILATATION & CURETTAGE/HYSTEROSCOPY  WITH HYDROTHERMAL ABLATION;  Surgeon: Gerald Leitz, MD;  Location: [facility];  Service: Gynecology;  Laterality: N/A;   LAPAROSCOPIC TUBAL LIGATION Bilateral 07/22/2016   Procedure: LAPAROSCOPIC TUBAL LIGATION;  Surgeon: Gerald Leitz, MD;  Location: [facility];  Service: Gynecology;  Laterality: Bilateral;   TONSILLECTOMY     Family History  Problem Relation Age of Onset   Hypertension Other    Hypertension Mother    Thyroid disease Mother    High Cholesterol Mother    Obesity Mother    Heart disease Maternal Grandfather    High blood pressure Father    High Cholesterol Father    Obesity Father    Outpatient Medications Prior to Visit  Medication Sig Dispense Refill   acetaminophen (TYLENOL) 500 MG tablet Take 1,000 mg by mouth every 6 (six) hours as needed for headache (pain).     albuterol (VENTOLIN HFA) 108 (90 Base) MCG/ACT inhaler Inhale 1 puff into the lungs every 4 (four) hours as needed for wheezing or shortness of breath.      apixaban (ELIQUIS) 2.5 MG TABS tablet Take 1 tablet (2.5 mg total) by mouth 2 (two) times daily. 60 tablet 5   Ascorbic Acid (VITAMIN C PO) Take 1 tablet by mouth at bedtime.     Cholecalciferol (VITAMIN D) 125 MCG (5000 UT) CAPS Take 1 capsule by mouth daily at 12 noon. 30 capsule    vitamin k 100 MCG tablet Take 100 mcg by mouth daily.     Zinc 50 MG TABS Take 50 mg by mouth at bedtime.  COVID-19 mRNA vaccine, Pfizer, 30 MCG/0.3ML injection INJECT AS DIRECTED .3 mL 0   No facility-administered medications prior to visit.   No Known Allergies    Objective:   Today's Vitals   07/15/21 1357  BP: 124/80  Pulse: 90  Temp: (!) 97 F (36.1 C)  TempSrc: Temporal  SpO2: 98%  Weight: 291 lb 9.6 oz (132.3 kg)  Height: 5\' 4"  (1.626 m)   Body mass index is 50.05 kg/m.   General: Well developed, well nourished. No acute distress. Lungs: Clear to auscultation bilaterally. No wheezing, rales or rhonchi. CV: RRR without murmurs or rubs. There is a rare beat  that sounds like it occurs early. Pulses 2+   bilaterally. Psych: Alert and oriented. Normal mood and affect.  Health Maintenance Due  Topic Date Due   Pneumococcal Vaccine 83-85 Years old (1 - PCV) Never done   FOOT EXAM  Never done   OPHTHALMOLOGY EXAM  Never done   URINE MICROALBUMIN  Never done   Hepatitis C Screening  Never done   TETANUS/TDAP  Never done   PAP SMEAR-Modifier  03/14/2019   COVID-19 Vaccine (3 - Booster for Pfizer series) 10/15/2020   INFLUENZA VACCINE  02/24/2021   Lab Results Last CBC Lab Results  Component Value Date   WBC 8.3 06/03/2021   HGB 13.9 06/03/2021   HCT 41.2 06/03/2021   MCV 90.4 06/03/2021   MCH 30.5 06/03/2021   RDW 13.4 06/03/2021   PLT 177 06/03/2021   Last metabolic panel Lab Results  Component Value Date   GLUCOSE 94 06/03/2021   NA 138 06/03/2021   K 4.0 06/03/2021   CL 103 06/03/2021   CO2 27 06/03/2021   BUN 18 06/03/2021   CREATININE 1.00 06/03/2021   GFRNONAA >60 06/03/2021   CALCIUM 9.7 06/03/2021   PHOS 3.7 04/16/2020   PROT 7.1 06/03/2021   ALBUMIN 4.5 06/03/2021   LABGLOB 2.4 02/21/2020   AGRATIO 2.0 02/21/2020   BILITOT 1.2 06/03/2021   ALKPHOS 57 06/03/2021   AST 16 06/03/2021   ALT 17 06/03/2021   ANIONGAP 8 06/03/2021   EKG: Normal sinus rhythm (rate= 82)  Assessment & Plan:   1. Heart palpitations By auscultation, I do hear some occasional premature beats that were not captured on the EKG. I suspect she may need a Holter monitor to more effectively evaluate this. I will check her thyroid tests to rule out this as a potential cause. Her other recent labs are reassuring. I recommend she avoid stimulants. I will refer her to cardiology.   - EKG 12-Lead - T4, free - TSH - Ambulatory referral to Cardiology  13/02/2021, MD

## 2021-07-19 ENCOUNTER — Encounter: Payer: Self-pay | Admitting: Family

## 2021-08-15 ENCOUNTER — Ambulatory Visit: Payer: BC Managed Care – PPO | Admitting: Cardiology

## 2021-08-15 ENCOUNTER — Other Ambulatory Visit: Payer: Self-pay

## 2021-08-15 ENCOUNTER — Ambulatory Visit (INDEPENDENT_AMBULATORY_CARE_PROVIDER_SITE_OTHER): Payer: BC Managed Care – PPO

## 2021-08-15 DIAGNOSIS — Z86711 Personal history of pulmonary embolism: Secondary | ICD-10-CM

## 2021-08-15 DIAGNOSIS — Z6841 Body Mass Index (BMI) 40.0 and over, adult: Secondary | ICD-10-CM | POA: Diagnosis not present

## 2021-08-15 DIAGNOSIS — R002 Palpitations: Secondary | ICD-10-CM

## 2021-08-15 DIAGNOSIS — R0609 Other forms of dyspnea: Secondary | ICD-10-CM

## 2021-08-15 NOTE — Progress Notes (Signed)
Cardiology Consultation:    Date:  08/15/2021   ID:  Brittney Lester, DOB 04/08/1981, MRN [id number]  PCP:  Brittney Ng, NP  Cardiologist:  Brittney Balsam, MD   Referring MD: Brittney Mast, MD   Chief Complaint  Patient presents with   Palpitations    Ongoing since she was a teen. More frequently in the recent months especially during menstrual cycle     History of Present Illness:    Brittney Lester is a 41 y.o. female who is being seen today for the evaluation of I have palpitations at the request of Brittney Mast, MD past medical history significant for COVID-19 infection few months ago she did have complication of this infection with pulmonary emboli she is being anticoagulated since then.  She is on a low-dose anticoagulation right now.  She was referred to [place] because she of palpitations which she mean by that she could hear her and feel her heart stopping and then strong times in the chest that typically happen at evening time.  There is no sustained arrhythmia and there is no dizziness no shortness of breath no sweating associated with this sensation no chest pain.  She said she used to have this palpitation when she was young but then this palpitation went away now they recur.  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    Joint pain    Multifocal pneumonia    Obesity    Palpitations    Prediabetes    Swelling     Past Surgical History:  Procedure Laterality Date   COLPOSCOPY     DILITATION & CURRETTAGE/HYSTROSCOPY  WITH HYDROTHERMAL ABLATION N/A 07/22/2016   Procedure: DILATATION & CURETTAGE/HYSTEROSCOPY WITH HYDROTHERMAL ABLATION;  Surgeon: Gerald Leitz, MD;  Location: [facility];  Service: Gynecology;  Laterality: N/A;   LAPAROSCOPIC TUBAL LIGATION Bilateral 07/22/2016   Procedure: LAPAROSCOPIC TUBAL LIGATION;  Surgeon: Gerald Leitz, MD;  Location: [facility];  Service: Gynecology;  Laterality: Bilateral;   TONSILLECTOMY      Current Medications: Current Meds  Medication Sig   acetaminophen (TYLENOL) 500 MG tablet Take 1,000 mg by mouth every 6 (six) hours as needed for headache (pain).   albuterol (VENTOLIN HFA) 108 (90 Base) MCG/ACT inhaler Inhale 1 puff into the lungs every 4 (four) hours as needed for wheezing or shortness of breath.    apixaban (ELIQUIS) 2.5 MG TABS tablet Take 1 tablet (2.5 mg total) by mouth 2 (two) times daily.   Ascorbic Acid (VITAMIN C PO) Take 1 tablet by mouth at bedtime. Unknown strength   Cholecalciferol (VITAMIN D) 125 MCG (5000 UT) CAPS Take 1 capsule by mouth daily at 12 noon.   vitamin k 100 MCG tablet Take 90 mcg by mouth daily.   Zinc 50 MG TABS Take 50 mg by mouth at bedtime.     Allergies:   Patient has no known allergies.   Social History   Socioeconomic History  Marital status: Divorced    Spouse name: Not on file   Number of children: 3   Years of education: Not on file   Highest education level: Not on file  Occupational History   Occupation: Doctor, general practice  Tobacco Use   Smoking status: Never   Smokeless tobacco: Never  Vaping Use   Vaping Use: Never used  Substance and Sexual Activity   Alcohol use: Yes    Comment: occ   Drug use: No   Sexual activity: Yes    Birth control/protection: Condom  Other Topics Concern   Not on file  Social History Narrative   Not on file   Social Determinants of Health   Financial Resource Strain: Not on file  Food Insecurity: Not on file  Transportation Needs: Not on file  Physical Activity: Not on file   Stress: Not on file  Social Connections: Not on file     Family History: The patient's family history includes Heart disease in her maternal grandfather; High Cholesterol in her father and mother; High blood pressure in her father; Hypertension in her mother and another family member; Obesity in her father and mother; Thyroid disease in her mother. ROS:   Please see the history of present illness.    All 14 point review of systems negative except as described per history of present illness.  EKGs/Labs/Other Studies Reviewed:    The following studies were reviewed today:   EKG:  EKG is  ordered today.  The ekg ordered today demonstrates normal sinus rhythm normal P interval normal QS complex duration fulgent no ST segment changes  Recent Labs: 06/03/2021: ALT 17; BUN 18; Creatinine 1.00; Hemoglobin 13.9; Platelet Count 177; Potassium 4.0; Sodium 138 07/15/2021: TSH 0.87  Recent Lipid Panel    Component Value Date/Time   CHOL 162 01/17/2021 1011   CHOL 167 02/21/2020 1639   TRIG 160.0 (H) 01/17/2021 1011   HDL 42.90 01/17/2021 1011   HDL 51 02/21/2020 1639   CHOLHDL 4 01/17/2021 1011   VLDL 32.0 01/17/2021 1011   LDLCALC 87 01/17/2021 1011   LDLCALC 97 02/21/2020 1639    Physical Exam:    VS:  BP 114/78 (BP Location: Right Arm, Patient Position: Sitting)    Pulse 96    Ht 5\' 4"  (1.626 m)    Wt 290 lb (131.5 kg)    SpO2 97%    BMI 49.78 kg/m     Wt Readings from Last 3 Encounters:  08/15/21 290 lb (131.5 kg)  07/15/21 291 lb 9.6 oz (132.3 kg)  06/03/21 292 lb (132.5 kg)     GEN:  Well nourished, well developed in no acute distress HEENT: Normal NECK: No JVD; No carotid bruits LYMPHATICS: No lymphadenopathy CARDIAC: RRR, no murmurs, no rubs, no gallops RESPIRATORY:  Clear to auscultation without rales, wheezing or rhonchi  ABDOMEN: Soft, non-tender, non-distended MUSCULOSKELETAL:  No edema; No deformity  SKIN: Warm and dry NEUROLOGIC:  Alert and oriented x  3 PSYCHIATRIC:  Normal affect   ASSESSMENT:    1. Class 3 severe obesity due to excess calories with serious comorbidity and body mass index (BMI) of 50.0 to 59.9 in adult (HCC)   2. Palpitations   3. Dyspnea on exertion   4. History of pulmonary embolism    PLAN:    In order of problems listed above:  Palpitations: We will ask her to wear Zio patch for 2 weeks to see exactly what of arrhythmia we dealing with.  I will  not initiate any therapy until I know exactly what kind of arrhythmia it is.  I support evaluation she will have an echocardiogram done to check left ventricle ejection fraction. Obesity obesity problem she understands she is trying to work on weight loss. Dyspnea on exertion multifactorial, echocardiogram will be beneficial to look at pulmonary pressures well she does have history of pulmonary emboli and she is being treated with low-dose of anticoagulation.   Medication Adjustments/Labs and Tests Ordered: Current medicines are reviewed at length with the patient today.  Concerns regarding medicines are outlined above.  No orders of the defined types were placed in this encounter.  No orders of the defined types were placed in this encounter.   Signed, Georgeanna Leaobert J. Tova Vater, MD, [facility]. 08/15/2021 3:19 PM    [facility]

## 2021-08-15 NOTE — Patient Instructions (Signed)
Medication Instructions:  °Your physician recommends that you continue on your current medications as directed. Please refer to the Current Medication list given to you today. ° °*If you need a refill on your cardiac medications before your next appointment, please call your pharmacy* ° ° °Lab Work: °None °If you have labs (blood work) drawn today and your tests are completely normal, you will receive your results only by: °MyChart Message (if you have MyChart) OR °A paper copy in the mail °If you have any lab test that is abnormal or we need to change your treatment, we will call you to review the results. ° ° °Testing/Procedures: °A zio monitor was ordered today. It will remain on for 14 days. You will then return monitor and event diary in provided box. It takes 1-2 weeks for report to be downloaded and returned to us. We will call you with the results. If monitor falls off or has orange flashing light, please call Zio for further instructions.  ° °Your physician has requested that you have an echocardiogram. Echocardiography is a painless test that uses sound waves to create images of your heart. It provides your doctor with information about the size and shape of your heart and how well your heart’s chambers and valves are working. This procedure takes approximately one hour. There are no restrictions for this procedure. ° ° ° °Follow-Up: °At [facility], you and your health needs are our priority.  As part of our continuing mission to provide you with exceptional heart care, we have created designated Provider Care Teams.  These Care Teams include your primary Cardiologist (physician) and Advanced Practice Providers (APPs -  Physician Assistants and Nurse Practitioners) who all work together to provide you with the care you need, when you need it. ° °We recommend signing up for the patient portal called "MyChart".  Sign up information is provided on this After Visit Summary.  MyChart is used to connect with  patients for Virtual Visits (Telemedicine).  Patients are able to view lab/test results, encounter notes, upcoming appointments, etc.  Non-urgent messages can be sent to your provider as well.   °To learn more about what you can do with MyChart, go to [email or web address].   ° °Your next appointment:   °2 month(s) ° °The format for your next appointment:   °In Person ° °Provider:   °Robert Krasowski, MD  ° ° °Other Instructions °None  °

## 2021-08-19 ENCOUNTER — Ambulatory Visit (HOSPITAL_BASED_OUTPATIENT_CLINIC_OR_DEPARTMENT_OTHER): Payer: BC Managed Care – PPO

## 2021-09-02 ENCOUNTER — Other Ambulatory Visit: Payer: Self-pay

## 2021-09-02 ENCOUNTER — Ambulatory Visit (HOSPITAL_BASED_OUTPATIENT_CLINIC_OR_DEPARTMENT_OTHER)
Admission: RE | Admit: 2021-09-02 | Discharge: 2021-09-02 | Disposition: A | Discharge status: Home / Self Care | Payer: BC Managed Care – PPO | Source: Ambulatory Visit | Attending: Cardiology | Admitting: Cardiology

## 2021-09-02 DIAGNOSIS — R0609 Other forms of dyspnea: Secondary | ICD-10-CM | POA: Insufficient documentation

## 2021-09-02 DIAGNOSIS — Z6841 Body Mass Index (BMI) 40.0 and over, adult: Secondary | ICD-10-CM | POA: Diagnosis present

## 2021-09-02 DIAGNOSIS — R002 Palpitations: Secondary | ICD-10-CM | POA: Diagnosis not present

## 2021-09-02 DIAGNOSIS — Z86711 Personal history of pulmonary embolism: Secondary | ICD-10-CM | POA: Insufficient documentation

## 2021-09-02 LAB — ECHOCARDIOGRAM COMPLETE
AR max vel: 1.49 cm2
AV Area VTI: 1.69 cm2
AV Area mean vel: 1.59 cm2
AV Mean grad: 5 mmHg
AV Peak grad: 9.4 mmHg
Ao pk vel: 1.53 m/s
Area-P 1/2: 3.81 cm2
S' Lateral: 2.9 cm

## 2021-09-02 NOTE — Progress Notes (Signed)
°  Echocardiogram 2D Echocardiogram has been performed.  Roosvelt Maser F 09/02/2021, 4:47 PM

## 2021-09-05 ENCOUNTER — Telehealth: Payer: Self-pay

## 2021-09-05 NOTE — Telephone Encounter (Signed)
Patient notified of results.

## 2021-09-05 NOTE — Telephone Encounter (Signed)
-----   Message from Georgeanna Lea, MD sent at 09/03/2021 11:17 AM EST ----- Echocardiogram showed preserved left ventricle ejection fraction, overall looks.

## 2021-09-26 ENCOUNTER — Telehealth: Payer: Self-pay | Admitting: Cardiology

## 2021-09-26 ENCOUNTER — Encounter: Payer: Self-pay | Admitting: Cardiology

## 2021-09-26 ENCOUNTER — Other Ambulatory Visit: Payer: Self-pay

## 2021-09-26 MED ORDER — METOPROLOL SUCCINATE ER 25 MG PO TB24: 25.0000 mg | ORAL_TABLET | Freq: Every day | ORAL | 3 refills | Status: DC

## 2021-09-26 NOTE — Telephone Encounter (Signed)
Patient returned call for results 

## 2021-09-26 NOTE — Telephone Encounter (Signed)
Patient informed of results and orders placed per Dr. Wendy Poet recommendation. ?

## 2021-10-16 ENCOUNTER — Other Ambulatory Visit: Payer: Self-pay

## 2021-10-16 ENCOUNTER — Ambulatory Visit: Payer: BC Managed Care – PPO | Admitting: Cardiology

## 2021-10-16 ENCOUNTER — Encounter: Payer: Self-pay | Admitting: Cardiology

## 2021-10-16 VITALS — BP 132/82 | HR 93 | Ht 64.0 in | Wt 294.0 lb

## 2021-10-16 DIAGNOSIS — R0609 Other forms of dyspnea: Secondary | ICD-10-CM

## 2021-10-16 DIAGNOSIS — Z86711 Personal history of pulmonary embolism: Secondary | ICD-10-CM

## 2021-10-16 DIAGNOSIS — R002 Palpitations: Secondary | ICD-10-CM

## 2021-10-16 DIAGNOSIS — Z6841 Body Mass Index (BMI) 40.0 and over, adult: Secondary | ICD-10-CM

## 2021-10-16 NOTE — Patient Instructions (Signed)

## 2021-10-16 NOTE — Progress Notes (Signed)
?Cardiology Office Note:   ? ?Date:  10/16/2021  ? ?ID:  Brittney Lester, DOB 1981-03-04, MRN [id number] ? ?PCP:  Anne Ng, NP  ?Cardiologist:  Gypsy Balsam, MD   ? ?Referring MD: Anne Ng, NP  ? ?Chief Complaint  ?Patient presents with  ? Follow-up  ?   ?  ?Doing well ? ?History of Present Illness:   ? ?Brittney Lester is a 41 y.o. female with past medical history significant for palpitations, history of pulmonary emboli, obesity.  She was referred to [place] because of palpitations, she did wear a monitor monitor showed some PVCs as well as some nonsustained ventricular tachycardia.  She was symptomatic with PVCs but not with nonsustained V. tach.  She did have echocardiogram which showed preserved left ventricle ejection fraction.  Since that time I seen her since she went her monitor she remove the monitor since that time no palpitations at all I gave her a beta-blocker but she did not take it because she had no symptoms.  Otherwise seems to be doing well.  Shortness of breath gradually improving.  She still very busy doing a lot.  She is a Human resources officer. ? ?Past Medical History:  ?Diagnosis Date  ? Acute respiratory failure with hypoxia (HCC) 04/13/2020  ? Anxiety   ? COVID-19   ? COVID-19 virus infection 04/13/2020  ? Depression   ? Depression   ? Diabetes mellitus without complication (HCC)   ? gestational  ? Excessive and frequent menstruation 07/22/2016  ? Family history of adverse reaction to anesthesia   ? mother has nausea post surgery  ? Fatigue   ? Graves disease   ? no longer need medication treatment  ? Graves disease   ? HPV in female   ? Joint pain   ? Multifocal pneumonia   ? Obesity   ? Palpitations   ? Prediabetes   ? Swelling   ? ? ?Past Surgical History:  ?Procedure Laterality Date  ? COLPOSCOPY    ? DILITATION & CURRETTAGE/HYSTROSCOPY WITH HYDROTHERMAL ABLATION N/A 07/22/2016  ? Procedure: DILATATION & CURETTAGE/HYSTEROSCOPY WITH HYDROTHERMAL ABLATION;  Surgeon: Gerald Leitz,  MD;  Location: [facility];  Service: Gynecology;  Laterality: N/A;  ? LAPAROSCOPIC TUBAL LIGATION Bilateral 07/22/2016  ? Procedure: LAPAROSCOPIC TUBAL LIGATION;  Surgeon: Gerald Leitz, MD;  Location: [facility];  Service: Gynecology;  Laterality: Bilateral;  ? TONSILLECTOMY    ? ? ?Current Medications: ?Current Meds  ?Medication Sig  ? acetaminophen (TYLENOL) 500 MG tablet Take 1,000 mg by mouth every 6 (six) hours as needed for headache (pain).  ? albuterol (VENTOLIN HFA) 108 (90 Base) MCG/ACT inhaler Inhale 1 puff into the lungs every 4 (four) hours as needed for wheezing or shortness of breath.   ? apixaban (ELIQUIS) 2.5 MG TABS tablet Take 1 tablet (2.5 mg total) by mouth 2 (two) times daily.  ? Ascorbic Acid (VITAMIN C PO) Take 1 tablet by mouth at bedtime. Unknown strength  ? Cholecalciferol (VITAMIN D) 125 MCG (5000 UT) CAPS Take 1 capsule by mouth daily at 12 noon.  ? Zinc 50 MG TABS Take 50 mg by mouth at bedtime.  ?  ? ?Allergies:   Patient has no known allergies.  ? ?Social History  ? ?Socioeconomic History  ? Marital status: Divorced  ?  Spouse name: Not on file  ? Number of children: 3  ? Years of education: Not on file  ? Highest education level: Not on file  ?Occupational History  ?  Occupation: Doctor, general practicepeech Pathologist  ?Tobacco Use  ? Smoking status: Never  ? Smokeless tobacco: Never  ?Vaping Use  ? Vaping Use: Never used  ?Substance and Sexual Activity  ? Alcohol use: Yes  ?  Comment: occ  ? Drug use: No  ? Sexual activity: Yes  ?  Birth control/protection: Condom  ?Other Topics Concern  ? Not on file  ?Social History Narrative  ? Not on file  ? ?Social Determinants of Health  ? ?Financial Resource Strain: Not on file  ?Food Insecurity: Not on file  ?Transportation Needs: Not on file  ?Physical Activity: Not on file  ?Stress: Not on file  ?Social Connections: Not on file  ?  ? ?Family History: ?The patient's family history includes Heart disease in her maternal grandfather; High Cholesterol in her father and  mother; High blood pressure in her father; Hypertension in her mother and another family member; Obesity in her father and mother; Thyroid disease in her mother. ?ROS:   ?Please see the history of present illness.    ?All 14 point review of systems negative except as described per history of present illness ? ?EKGs/Labs/Other Studies Reviewed:   ? ? ? ?Recent Labs: ?06/03/2021: ALT 17; BUN 18; Creatinine 1.00; Hemoglobin 13.9; Platelet Count 177; Potassium 4.0; Sodium 138 ?07/15/2021: TSH 0.87  ?Recent Lipid Panel ?   ?Component Value Date/Time  ? CHOL 162 01/17/2021 1011  ? CHOL 167 02/21/2020 1639  ? TRIG 160.0 (H) 01/17/2021 1011  ? HDL 42.90 01/17/2021 1011  ? HDL 51 02/21/2020 1639  ? CHOLHDL 4 01/17/2021 1011  ? VLDL 32.0 01/17/2021 1011  ? LDLCALC 87 01/17/2021 1011  ? LDLCALC 97 02/21/2020 1639  ? ? ?Physical Exam:   ? ?VS:  BP 132/82 (BP Location: Right Arm, Patient Position: Sitting)   Pulse 93   Ht 5\' 4"  (1.626 m)   Wt 294 lb (133.4 kg)   SpO2 96%   BMI 50.46 kg/m?    ? ?Wt Readings from Last 3 Encounters:  ?10/16/21 294 lb (133.4 kg)  ?08/15/21 290 lb (131.5 kg)  ?07/15/21 291 lb 9.6 oz (132.3 kg)  ?  ? ?GEN:  Well nourished, well developed in no acute distress ?HEENT: Normal ?NECK: No JVD; No carotid bruits ?LYMPHATICS: No lymphadenopathy ?CARDIAC: RRR, no murmurs, no rubs, no gallops ?RESPIRATORY:  Clear to auscultation without rales, wheezing or rhonchi  ?ABDOMEN: Soft, non-tender, non-distended ?MUSCULOSKELETAL:  No edema; No deformity  ?SKIN: Warm and dry ?LOWER EXTREMITIES: no swelling ?NEUROLOGIC:  Alert and oriented x 3 ?PSYCHIATRIC:  Normal affect  ? ?ASSESSMENT:   ? ?1. Palpitations   ?2. History of pulmonary embolism   ?3. Dyspnea on exertion   ?4. Class 3 severe obesity due to excess calories with serious comorbidity and body mass index (BMI) of 50.0 to 59.9 in adult [facility](HCC)   ? ?PLAN:   ? ?In order of problems listed above: ? ?Palpitations.  Now gone without any interventions.  We gave her  prescription for metoprolol however she did not take it I asked her to start taking this medication and see how she reacted.  I also warned her about signs and symptoms of palpitations/V. tach.  I asked her to let me know if she gets episode of syncope dizziness. ?Nonsustained V. tach only 5 beats in a row.  Echocardiogram normal ejection fraction absolutely no indications for ?Dyspnea on exertion, echocardiogram was normal.  No cardiac reason for this most likely obesity play a role here. ? ? ?  Medication Adjustments/Labs and Tests Ordered: ?Current medicines are reviewed at length with the patient today.  Concerns regarding medicines are outlined above.  ?No orders of the defined types were placed in this encounter. ? ?Medication changes: No orders of the defined types were placed in this encounter. ? ? ?Signed, ?Georgeanna Lea, MD, [facility] ?10/16/2021 4:17 PM    ?[facility] ?

## 2021-12-01 ENCOUNTER — Other Ambulatory Visit: Payer: BC Managed Care – PPO

## 2021-12-01 ENCOUNTER — Ambulatory Visit: Payer: BC Managed Care – PPO | Admitting: Hematology & Oncology

## 2021-12-01 ENCOUNTER — Inpatient Hospital Stay: Payer: BC Managed Care – PPO | Admitting: Family

## 2021-12-01 ENCOUNTER — Inpatient Hospital Stay: Payer: BC Managed Care – PPO | Attending: Hematology & Oncology

## 2022-01-07 ENCOUNTER — Other Ambulatory Visit (HOSPITAL_BASED_OUTPATIENT_CLINIC_OR_DEPARTMENT_OTHER): Payer: Self-pay | Admitting: Nurse Practitioner

## 2022-01-07 DIAGNOSIS — Z1231 Encounter for screening mammogram for malignant neoplasm of breast: Secondary | ICD-10-CM

## 2022-01-12 ENCOUNTER — Encounter (HOSPITAL_BASED_OUTPATIENT_CLINIC_OR_DEPARTMENT_OTHER): Payer: Self-pay

## 2022-01-12 ENCOUNTER — Ambulatory Visit (HOSPITAL_BASED_OUTPATIENT_CLINIC_OR_DEPARTMENT_OTHER)
Admission: RE | Admit: 2022-01-12 | Discharge: 2022-01-12 | Disposition: A | Discharge status: Home / Self Care | Payer: BC Managed Care – PPO | Source: Ambulatory Visit | Attending: Nurse Practitioner | Admitting: Nurse Practitioner

## 2022-01-12 DIAGNOSIS — Z1231 Encounter for screening mammogram for malignant neoplasm of breast: Secondary | ICD-10-CM | POA: Insufficient documentation

## 2022-01-28 IMAGING — DX DG CHEST 1V PORT
1 series · 1 of 1 positions shown · non-contrast
Comparison: None.

CLINICAL DATA: Shortness of breath.  [id number] positive.

EXAM:
PORTABLE CHEST 1 VIEW

[chest]
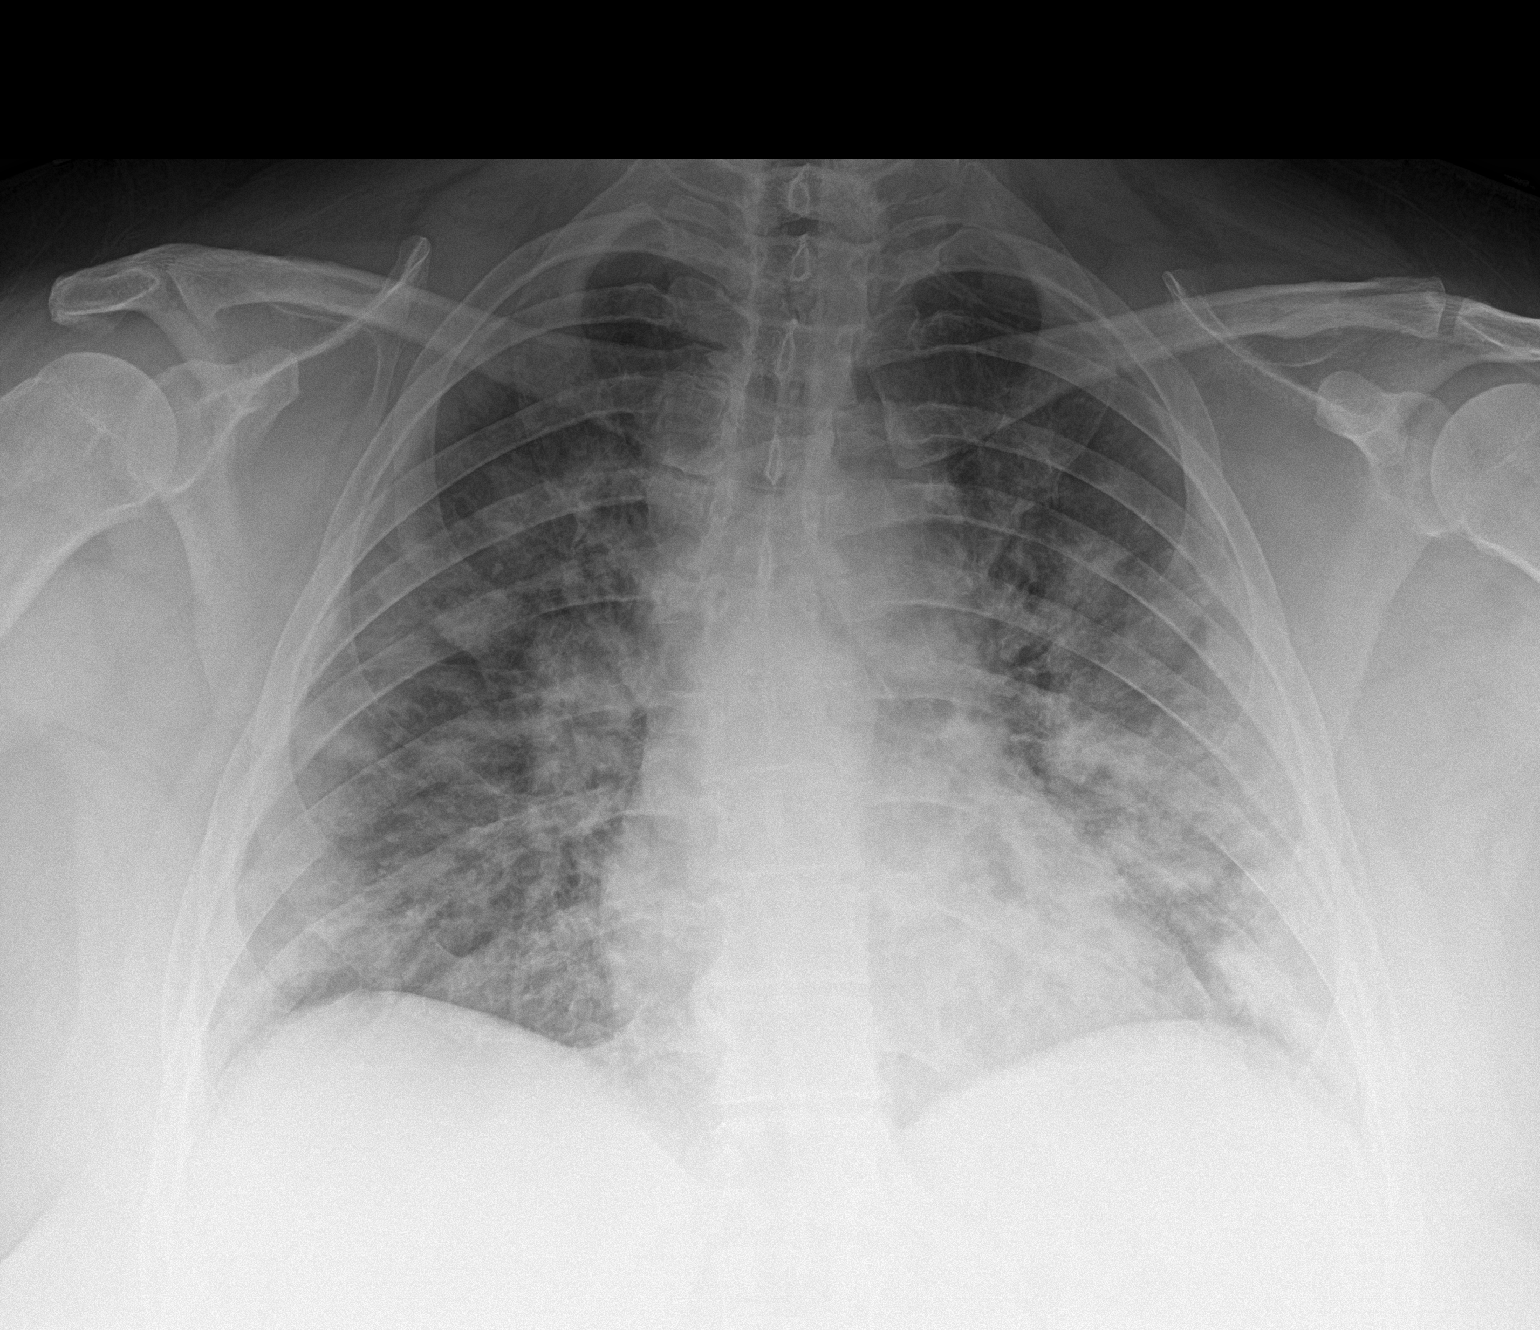

[1 of 1 positions shown; findings below may reference images not displayed]

FINDINGS: Lungs are adequately inflated demonstrate moderate patchy bilateral
airspace process probably over the mid to lower lungs. No effusion
or pneumothorax. Cardiomediastinal silhouette and remainder the exam
is unremarkable.
IMPRESSION: Moderate patchy bilateral airspace process likely multifocal
pneumonia.

## 2022-03-04 ENCOUNTER — Encounter (INDEPENDENT_AMBULATORY_CARE_PROVIDER_SITE_OTHER): Payer: Self-pay

## 2022-07-01 ENCOUNTER — Encounter: Payer: Self-pay | Admitting: Cardiology

## 2022-07-01 ENCOUNTER — Ambulatory Visit: Payer: BC Managed Care – PPO | Attending: Cardiology | Admitting: Cardiology

## 2022-07-01 VITALS — BP 112/82 | HR 76 | Ht 64.0 in | Wt 297.0 lb

## 2022-07-01 DIAGNOSIS — R002 Palpitations: Secondary | ICD-10-CM | POA: Diagnosis not present

## 2022-07-01 DIAGNOSIS — Z86711 Personal history of pulmonary embolism: Secondary | ICD-10-CM | POA: Diagnosis not present

## 2022-07-01 DIAGNOSIS — Z6841 Body Mass Index (BMI) 40.0 and over, adult: Secondary | ICD-10-CM | POA: Diagnosis not present

## 2022-07-01 NOTE — Patient Instructions (Signed)

## 2022-07-01 NOTE — Progress Notes (Signed)
Cardiology Office Note:    Date:  07/01/2022   ID:  Brittney Lester, DOB [birth year], MRN [id number]  PCP:  Anne Ng, NP  Cardiologist:  Gypsy Balsam, MD    Referring MD: Anne Ng, NP   Chief Complaint  Patient presents with   Follow-up    Palpitations resolved    History of Present Illness:    Brittney Lester is a 41 y.o. female with past medical history significant for pulmonary emboli, she was referred to [place] however because of palpitations.  Monitor showed PVCs.  She also had nonsustained ventricular tachycardia, stratification workup thereafter included echocardiogram showing preserved left ventricle ejection fraction.  Since that time she is doing well.  Palpitations are gone overall she is fine she is trying to exercise on the regular basis she is trying to walk.  Denies have any chest pain tightness squeezing pressure burning chest no palpitations.  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    Joint pain    Multifocal pneumonia    Obesity    Palpitations    Prediabetes    Swelling     Past Surgical History:  Procedure Laterality Date   COLPOSCOPY     DILITATION & CURRETTAGE/HYSTROSCOPY WITH HYDROTHERMAL ABLATION N/A 07/22/2016   Procedure: DILATATION & CURETTAGE/HYSTEROSCOPY WITH HYDROTHERMAL ABLATION;  Surgeon: Gerald Leitz, MD;  Location: [facility];  Service: Gynecology;  Laterality: N/A;   LAPAROSCOPIC TUBAL LIGATION Bilateral 07/22/2016   Procedure: LAPAROSCOPIC TUBAL LIGATION;  Surgeon: Gerald Leitz, MD;   Location: [facility];  Service: Gynecology;  Laterality: Bilateral;   TONSILLECTOMY      Current Medications: Current Meds  Medication Sig   acetaminophen (TYLENOL) 500 MG tablet Take 1,000 mg by mouth every 6 (six) hours as needed for headache (pain).   albuterol (VENTOLIN HFA) 108 (90 Base) MCG/ACT inhaler Inhale 1 puff into the lungs every 4 (four) hours as needed for wheezing or shortness of breath.    apixaban (ELIQUIS) 2.5 MG TABS tablet Take 1 tablet (2.5 mg total) by mouth 2 (two) times daily.   Ascorbic Acid (VITAMIN C PO) Take 1 tablet by mouth at bedtime. Unknown strength   Zinc 50 MG TABS Take 50 mg by mouth at bedtime.     Allergies:   Patient has no known allergies.   Social History   Socioeconomic History   Marital status: Divorced    Spouse name: Not on file   Number of children: 3   Years of education: Not on file   Highest education level: Not on file  Occupational History   Occupation: Doctor, general practice  Tobacco Use   Smoking status: Never   Smokeless tobacco: Never  Vaping Use   Vaping Use: Never used  Substance and Sexual Activity   Alcohol use: Yes    Comment: occ   Drug use: No   Sexual activity: Yes    Birth control/protection: Condom  Other Topics Concern   Not on  file  Social History Narrative   Not on file   Social Determinants of Health   Financial Resource Strain: Not on file  Food Insecurity: Not on file  Transportation Needs: Not on file  Physical Activity: Not on file  Stress: Not on file  Social Connections: Not on file     Family History: The patient's family history includes Heart disease in her maternal grandfather; High Cholesterol in her father and mother; High blood pressure in her father; Hypertension in her mother and another family member; Obesity in her father and mother; Thyroid disease in her mother. ROS:   Please see the history of present illness.    All 14 point review of systems negative except as described per  history of present illness  EKGs/Labs/Other Studies Reviewed:      Recent Labs: 07/15/2021: TSH 0.87  Recent Lipid Panel    Component Value Date/Time   CHOL 162 01/17/2021 1011   CHOL 167 02/21/2020 1639   TRIG 160.0 (H) 01/17/2021 1011   HDL 42.90 01/17/2021 1011   HDL 51 02/21/2020 1639   CHOLHDL 4 01/17/2021 1011   VLDL 32.0 01/17/2021 1011   LDLCALC 87 01/17/2021 1011   LDLCALC 97 02/21/2020 1639    Physical Exam:    VS:  BP 112/82 (BP Location: Left Arm, Patient Position: Sitting)   Pulse 76   Ht 5\' 4"  (1.626 m)   Wt 297 lb (134.7 kg)   SpO2 96%   BMI 50.98 kg/m     Wt Readings from Last 3 Encounters:  07/01/22 297 lb (134.7 kg)  10/16/21 294 lb (133.4 kg)  08/15/21 290 lb (131.5 kg)     GEN:  Well nourished, well developed in no acute distress HEENT: Normal NECK: No JVD; No carotid bruits LYMPHATICS: No lymphadenopathy CARDIAC: RRR, no murmurs, no rubs, no gallops RESPIRATORY:  Clear to auscultation without rales, wheezing or rhonchi  ABDOMEN: Soft, non-tender, non-distended MUSCULOSKELETAL:  No edema; No deformity  SKIN: Warm and dry LOWER EXTREMITIES: no swelling NEUROLOGIC:  Alert and oriented x 3 PSYCHIATRIC:  Normal affect   ASSESSMENT:    1. Palpitations   2. History of pulmonary embolism   3. Class 3 severe obesity due to excess calories with serious comorbidity and body mass index (BMI) of 50.0 to 59.9 in adult [facility])    PLAN:    In order of problems listed above:  Palpitations does not count.  Will continue present management.  I warned her that palpitations do have tendency to recur.  Will continue monitoring. History of pulmonary emboli anticoagulated with small dose of Eliquis which I will continue. Obesity.  She is important for her to lose weight she understand that she is trying to build to be more active. Dyslipidemia I did review her K PN which show LDL 87 HDL 42 her TSH was [facility].  She just sent a message to her primary care  physician to get annual physical and dose test need to be rechecked.   Medication Adjustments/Labs and Tests Ordered: Current medicines are reviewed at length with the patient today.  Concerns regarding medicines are outlined above.  No orders of the defined types were placed in this encounter.  Medication changes: No orders of the defined types were placed in this encounter.   Signed, 1.61, MD, [facility] 07/01/2022 9:13 AM    [facility]

## 2022-09-11 ENCOUNTER — Encounter: Payer: Self-pay | Admitting: Nurse Practitioner

## 2022-09-11 ENCOUNTER — Ambulatory Visit (INDEPENDENT_AMBULATORY_CARE_PROVIDER_SITE_OTHER): Payer: BC Managed Care – PPO | Admitting: Nurse Practitioner

## 2022-09-11 VITALS — BP 102/80 | HR 84 | Temp 98.2°F | Resp 16 | Ht 64.0 in | Wt 297.0 lb

## 2022-09-11 DIAGNOSIS — Z136 Encounter for screening for cardiovascular disorders: Secondary | ICD-10-CM | POA: Diagnosis not present

## 2022-09-11 DIAGNOSIS — Z23 Encounter for immunization: Secondary | ICD-10-CM

## 2022-09-11 DIAGNOSIS — E559 Vitamin D deficiency, unspecified: Secondary | ICD-10-CM

## 2022-09-11 DIAGNOSIS — Z6841 Body Mass Index (BMI) 40.0 and over, adult: Secondary | ICD-10-CM

## 2022-09-11 DIAGNOSIS — Z8639 Personal history of other endocrine, nutritional and metabolic disease: Secondary | ICD-10-CM

## 2022-09-11 DIAGNOSIS — Z0001 Encounter for general adult medical examination with abnormal findings: Secondary | ICD-10-CM | POA: Diagnosis not present

## 2022-09-11 DIAGNOSIS — R739 Hyperglycemia, unspecified: Secondary | ICD-10-CM

## 2022-09-11 DIAGNOSIS — Z13220 Encounter for screening for lipoid disorders: Secondary | ICD-10-CM

## 2022-09-11 DIAGNOSIS — Z8632 Personal history of gestational diabetes: Secondary | ICD-10-CM | POA: Insufficient documentation

## 2022-09-11 LAB — LIPID PANEL
Cholesterol: 144 mg/dL (ref 0–200)
HDL: 41.5 mg/dL (ref >39.00)
LDL Cholesterol: 73 mg/dL (ref 0–99)
NonHDL: 102.73
Total CHOL/HDL Ratio: 3
Triglycerides: 147 mg/dL (ref 0.0–149.0)
VLDL: 29.4 mg/dL (ref 0.0–40.0)

## 2022-09-11 LAB — COMPREHENSIVE METABOLIC PANEL
ALT: 24 U/L (ref 0–35)
AST: 16 U/L (ref 0–37)
Albumin: 4.4 g/dL (ref 3.5–5.2)
Alkaline Phosphatase: 58 U/L (ref 39–117)
BUN: 17 mg/dL (ref 6–23)
CO2: 27 mEq/L (ref 19–32)
Calcium: 9 mg/dL (ref 8.4–10.5)
Chloride: 104 mEq/L (ref 96–112)
Creatinine, Ser: 0.94 mg/dL (ref 0.40–1.20)
GFR: 74.1 mL/min (ref >60.00)
Glucose, Bld: 123 mg/dL — ABNORMAL HIGH (ref 70–99)
Potassium: 3.9 mEq/L (ref 3.5–5.1)
Sodium: 140 mEq/L (ref 135–145)
Total Bilirubin: 1.2 mg/dL (ref 0.2–1.2)
Total Protein: 6.9 g/dL (ref 6.0–8.3)

## 2022-09-11 LAB — HEMOGLOBIN A1C: Hgb A1c MFr Bld: 4.8 % (ref 4.6–6.5)

## 2022-09-11 LAB — TSH: TSH: 0.97 u[IU]/mL (ref 0.35–5.50)

## 2022-09-11 LAB — VITAMIN D 25 HYDROXY (VIT D DEFICIENCY, FRACTURES): VITD: 37.75 ng/mL (ref 30.00–100.00)

## 2022-09-11 LAB — T4, FREE: Free T4: 0.98 ng/dL (ref 0.60–1.60)

## 2022-09-11 NOTE — Progress Notes (Signed)
Stable Follow instructions as discussed during office visit.

## 2022-09-11 NOTE — Patient Instructions (Signed)
Go to lab  Preventive Care 40-44 Years Old, Female Preventive care refers to lifestyle choices and visits with your health care provider that can promote health and wellness. Preventive care visits are also called wellness exams. What can I expect for my preventive care visit? Counseling Your health care provider may ask you questions about your: Medical history, including: Past medical problems. Family medical history. Pregnancy history. Current health, including: Menstrual cycle. Method of birth control. Emotional well-being. Home life and relationship well-being. Sexual activity and sexual health. Lifestyle, including: Alcohol, nicotine or tobacco, and drug use. Access to firearms. Diet, exercise, and sleep habits. Work and work environment. Sunscreen use. Safety issues such as seatbelt and bike helmet use. Physical exam Your health care provider will check your: Height and weight. These may be used to calculate your BMI (body mass index). BMI is a measurement that tells if you are at a healthy weight. Waist circumference. This measures the distance around your waistline. This measurement also tells if you are at a healthy weight and may help predict your risk of certain diseases, such as type 2 diabetes and high blood pressure. Heart rate and blood pressure. Body temperature. Skin for abnormal spots. What immunizations do I need?  Vaccines are usually given at various ages, according to a schedule. Your health care provider will recommend vaccines for you based on your age, medical history, and lifestyle or other factors, such as travel or where you work. What tests do I need? Screening Your health care provider may recommend screening tests for certain conditions. This may include: Lipid and cholesterol levels. Diabetes screening. This is done by checking your blood sugar (glucose) after you have not eaten for a while (fasting). Pelvic exam and Pap test. Hepatitis B  test. Hepatitis C test. HIV (human immunodeficiency virus) test. STI (sexually transmitted infection) testing, if you are at risk. Lung cancer screening. Colorectal cancer screening. Mammogram. Talk with your health care provider about when you should start having regular mammograms. This may depend on whether you have a family history of breast cancer. BRCA-related cancer screening. This may be done if you have a family history of breast, ovarian, tubal, or peritoneal cancers. Bone density scan. This is done to screen for osteoporosis. Talk with your health care provider about your test results, treatment options, and if necessary, the need for more tests. Follow these instructions at home: Eating and drinking  Eat a diet that includes fresh fruits and vegetables, whole grains, lean protein, and low-fat dairy products. Take vitamin and mineral supplements as recommended by your health care provider. Do not drink alcohol if: Your health care provider tells you not to drink. You are pregnant, may be pregnant, or are planning to become pregnant. If you drink alcohol: Limit how much you have to 0-1 drink a day. Know how much alcohol is in your drink. In the [place] one drink equals one 12 oz bottle of beer (355 mL), one 5 oz glass of wine (148 mL), or one 1 oz glass of hard liquor (44 mL). Lifestyle Brush your teeth every morning and night with fluoride toothpaste. Floss one time each day. Exercise for at least 30 minutes 5 or more days each week. Do not use any products that contain nicotine or tobacco. These products include cigarettes, chewing tobacco, and vaping devices, such as e-cigarettes. If you need help quitting, ask your health care provider. Do not use drugs. If you are sexually active, practice safe sex. Use a condom or other   form of protection to prevent STIs. If you do not wish to become pregnant, use a form of birth control. If you plan to become pregnant, see your health care  provider for a prepregnancy visit. Take aspirin only as told by your health care provider. Make sure that you understand how much to take and what form to take. Work with your health care provider to find out whether it is safe and beneficial for you to take aspirin daily. Find healthy ways to manage stress, such as: Meditation, yoga, or listening to music. Journaling. Talking to a trusted person. Spending time with friends and family. Minimize exposure to UV radiation to reduce your risk of skin cancer. Safety Always wear your seat belt while driving or riding in a vehicle. Do not drive: If you have been drinking alcohol. Do not ride with someone who has been drinking. When you are tired or distracted. While texting. If you have been using any mind-altering substances or drugs. Wear a helmet and other protective equipment during sports activities. If you have firearms in your house, make sure you follow all gun safety procedures. Seek help if you have been physically or sexually abused. What's next? Visit your health care provider once a year for an annual wellness visit. Ask your health care provider how often you should have your eyes and teeth checked. Stay up to date on all vaccines. This information is not intended to replace advice given to you by your health care provider. Make sure you discuss any questions you have with your health care provider. Document Revised: 01/08/2021 Document Reviewed: 01/08/2021 Elsevier Patient Education  2023 Elsevier Inc.  

## 2022-09-11 NOTE — Progress Notes (Signed)
Complete physical exam  Patient: Brittney Lester   DOB: 1981/03/11   42 y.o. Female  MRN: [id number] Visit Date: 09/11/2022  Subjective:    Chief Complaint  Patient presents with   Annual Exam    tdap   Brittney Lester is a 42 y.o. female who presents today for a complete physical exam. She reports consuming a general diet.  No consistent exercise  She generally feels well. She reports sleeping well. She does not have additional problems to discuss today.  Vision:No Dental:Yes STD Screen:No  BP Readings from Last 3 Encounters:  09/11/22 102/80  07/01/22 112/82  10/16/21 132/82   Wt Readings from Last 3 Encounters:  09/11/22 297 lb (134.7 kg)  07/01/22 297 lb (134.7 kg)  10/16/21 294 lb (133.4 kg)   Most recent fall risk assessment:    09/11/2022    1:54 PM  [place] in the past year? 0  Number falls in past yr: 0  Injury with Fall? 0  Risk for fall due to : No Fall Risks  Follow up Falls evaluation completed   Depression screen:Yes - No Depression  Most recent depression screenings:    09/11/2022    1:01 PM 01/17/2021    9:34 AM  PHQ 2/9 Scores  PHQ - 2 Score 0 0  PHQ- 9 Score  3    HPI  Hyperglycemia Repeat hgbA1c Hx of gestational Dm  History of pulmonary embolus (PE) Unprovoked, negative hypercoagulation work up Repeat CT chest: PE resolved Completed eliquis use x 43month  Hx of Hashimoto thyroiditis Repeat TSH and T4 No palpable goiter or nodule  Palpitations Had eval by cardiology Long term holter monitor: NSR with occasional PVCs Metoprolol prescribed, but she did not take me She reports symptoms resolved spontaneously   Past Medical History:  Diagnosis Date   Acute respiratory failure with hypoxia ([facility] 04/13/2020   Anxiety    COVID-19    COVID-19 virus infection 04/13/2020   Depression    Depression    Diabetes mellitus without complication ([place]    gestational   Excessive and frequent menstruation 07/22/2016   Family  history of adverse reaction to anesthesia    mother has nausea post surgery   Fatigue    Graves disease    no longer need medication treatment   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    Joint pain    Multifocal pneumonia    Obesity    Palpitations    Prediabetes    Swelling    Past Surgical History:  Procedure Laterality Date   COLPOSCOPY     DILITATION & CURRETTAGE/HYSTROSCOPY WITH HYDROTHERMAL ABLATION N/A 07/22/2016   Procedure: DILATATION & CURETTAGE/HYSTEROSCOPY WITH HYDROTHERMAL ABLATION;  Surgeon: TChristophe Louis MD;  [facility];  Service: Gynecology;  Laterality: N/A;   LAPAROSCOPIC TUBAL LIGATION Bilateral 07/22/2016   Procedure: LAPAROSCOPIC TUBAL LIGATION;  Surgeon: TChristophe Louis MD;  [facility];  Service: Gynecology;  Laterality: Bilateral;   TONSILLECTOMY     TUBAL LIGATION  2017   Social History   Socioeconomic History   Marital status: Divorced    Spouse name: Not on file   Number of children: 3   Years of education: Not on file   Highest education level: Not on file  Occupational History   Occupation: SElectrical engineer Tobacco Use   Smoking status: Never   Smokeless tobacco: Never  Vaping Use   Vaping Use: Never used  Substance and  Sexual Activity   Alcohol use: Not Currently    Alcohol/week: 1.0 standard drink of alcohol    Types: 1 Standard drinks or equivalent per week    Comment: occ   Drug use: No   Sexual activity: Yes    Birth control/protection: Condom    Comment: Ablation  Other Topics Concern   Not on file  Social History Narrative   Not on file   Social Determinants of Health   Financial Resource Strain: Not on file  Food Insecurity: Not on file  Transportation Needs: Not on file  Physical Activity: Not on file  Stress: Not on file  Social Connections: Not on file  Intimate Partner Violence: Not on file   Family Status  Relation Name Status   Mother Izora Gala (Not Specified)   Father Gareth Eagle (Not Specified)   MGF Nadara Mustard (Not  Specified)   Other  (Not Specified)   Family History  Problem Relation Age of Onset   Hypertension Mother    Thyroid disease Mother    High Cholesterol Mother    Obesity Mother    High blood pressure Father    High Cholesterol Father    Obesity Father    Myasthenia gravis Father 28   Heart disease Maternal Grandfather    Hypertension Other    No Known Allergies  Patient Care Team: Williemae Muriel, Charlene Brooke, NP as PCP - General (Internal Medicine) Christophe Louis, MD as Consulting Physician (Obstetrics and Gynecology)   Medications: Outpatient Medications Prior to Visit  Medication Sig   acetaminophen (TYLENOL) 500 MG tablet Take 1,000 mg by mouth every 6 (six) hours as needed for headache (pain).   albuterol (VENTOLIN HFA) 108 (90 Base) MCG/ACT inhaler Inhale 1 puff into the lungs every 4 (four) hours as needed for wheezing or shortness of breath.    Ascorbic Acid (VITAMIN C PO) Take 1 tablet by mouth at bedtime. Unknown strength   Cholecalciferol (VITAMIN D) 125 MCG (5000 UT) CAPS Take 1 capsule by mouth daily at 12 noon.   Zinc 50 MG TABS Take 50 mg by mouth at bedtime.   [DISCONTINUED] apixaban (ELIQUIS) 2.5 MG TABS tablet Take 1 tablet (2.5 mg total) by mouth 2 (two) times daily. (Patient not taking: Reported on 09/11/2022)   No facility-administered medications prior to visit.   Review of Systems  Constitutional:  Negative for activity change, appetite change and unexpected weight change.  Respiratory: Negative.    Cardiovascular: Negative.   Gastrointestinal: Negative.   Endocrine: Negative for cold intolerance and heat intolerance.  Genitourinary: Negative.   Musculoskeletal: Negative.   Skin: Negative.   Neurological: Negative.   Hematological: Negative.   Psychiatric/Behavioral:  Negative for behavioral problems, decreased concentration, dysphoric mood, hallucinations, self-injury, sleep disturbance and suicidal ideas. The patient is not nervous/anxious.    Last CBC Lab  Results  Component Value Date   WBC 8.3 06/03/2021   HGB 13.9 06/03/2021   HCT 41.2 06/03/2021   MCV 90.4 06/03/2021   MCH 30.5 06/03/2021   RDW 13.4 06/03/2021   PLT 177 [id number]   Last metabolic panel Lab Results  Component Value Date   GLUCOSE 123 (H) 09/11/2022   NA 140 09/11/2022   K 3.9 09/11/2022   CL 104 09/11/2022   CO2 27 09/11/2022   BUN 17 09/11/2022   CREATININE 0.94 09/11/2022   GFRNONAA >60 06/03/2021   CALCIUM 9.0 09/11/2022   PHOS 3.7 04/16/2020   PROT 6.9 09/11/2022   ALBUMIN 4.4 09/11/2022   LABGLOB  2.4 02/21/2020   AGRATIO 2.0 02/21/2020   BILITOT 1.2 09/11/2022   ALKPHOS 58 09/11/2022   AST 16 09/11/2022   ALT 24 09/11/2022   ANIONGAP 8 06/03/2021   Last lipids Lab Results  Component Value Date   CHOL 144 09/11/2022   HDL 41.50 09/11/2022   LDLCALC 73 09/11/2022   TRIG 147.0 09/11/2022   CHOLHDL 3 09/11/2022   Last hemoglobin A1c Lab Results  Component Value Date   HGBA1C 4.8 09/11/2022   Last thyroid functions Lab Results  Component Value Date   TSH 0.97 09/11/2022   T3TOTAL 125 02/21/2020   T4TOTAL 8.8 01/17/2021   Last vitamin D Lab Results  Component Value Date   VD25OH 37.75 09/11/2022        Objective:  BP 102/80 (BP Location: Left Arm, Patient Position: Sitting, Cuff Size: Large)   Pulse 84   Temp 98.2 F (36.8 C) (Temporal)   Resp 16   Ht 5' 4"$  (1.626 m)   Wt 297 lb (134.7 kg)   SpO2 98%   BMI 50.98 kg/m     Physical Exam Vitals and nursing note reviewed.  Constitutional:      General: She is not in acute distress. HENT:     Right Ear: Tympanic membrane, ear canal and external ear normal.     Left Ear: Tympanic membrane, ear canal and external ear normal.     Nose: Nose normal.  Eyes:     Extraocular Movements: Extraocular movements intact.     Conjunctiva/sclera: Conjunctivae normal.     Pupils: Pupils are equal, round, and reactive to light.  Neck:     Thyroid: No thyroid mass, thyromegaly or  thyroid tenderness.  Cardiovascular:     Rate and Rhythm: Normal rate and regular rhythm.     Pulses: Normal pulses.     Heart sounds: Normal heart sounds.  Pulmonary:     Effort: Pulmonary effort is normal.     Breath sounds: Normal breath sounds.  Abdominal:     General: Bowel sounds are normal.     Palpations: Abdomen is soft.  Genitourinary:    Comments: Deferred breast and pelvic exam to GYN Musculoskeletal:        General: Normal range of motion.     Cervical back: Normal range of motion and neck supple.     Right lower leg: No edema.     Left lower leg: No edema.  Lymphadenopathy:     Cervical: No cervical adenopathy.  Skin:    General: Skin is warm and dry.  Neurological:     Mental Status: She is alert and oriented to person, place, and time.     Cranial Nerves: No cranial nerve deficit.  Psychiatric:        Mood and Affect: Mood normal.        Behavior: Behavior normal.        Thought Content: Thought content normal.     Results for orders placed or performed in visit on 09/11/22  Comprehensive metabolic panel  Result Value Ref Range   Sodium 140 135 - 145 mEq/L   Potassium 3.9 3.5 - 5.1 mEq/L   Chloride 104 96 - 112 mEq/L   CO2 27 19 - 32 mEq/L   Glucose, Bld 123 (H) 70 - 99 mg/dL   BUN 17 6 - 23 mg/dL   Creatinine, Ser 0.94 0.40 - 1.20 mg/dL   Total Bilirubin 1.2 0.2 - 1.2 mg/dL   Alkaline Phosphatase 58 39 -  117 U/L   AST 16 0 - 37 U/L   ALT 24 0 - 35 U/L   Total Protein 6.9 6.0 - 8.3 g/dL   Albumin 4.4 3.5 - 5.2 g/dL   GFR 74.10 >60.00 mL/min   Calcium 9.0 8.4 - 10.5 mg/dL  Lipid panel  Result Value Ref Range   Cholesterol 144 0 - 200 mg/dL   Triglycerides 147.0 0.0 - 149.0 mg/dL   HDL 41.50 >39.00 mg/dL   VLDL 29.4 0.0 - 40.0 mg/dL   LDL Cholesterol 73 0 - 99 mg/dL   Total CHOL/HDL Ratio 3    NonHDL 102.73   TSH  Result Value Ref Range   TSH 0.97 0.35 - 5.50 uIU/mL  VITAMIN D 25 Hydroxy (Vit-D Deficiency, Fractures)  Result Value Ref Range    VITD 37.75 30.00 - 100.00 ng/mL  Hemoglobin A1c  Result Value Ref Range   Hgb A1c MFr Bld 4.8 4.6 - 6.5 %  T4, free  Result Value Ref Range   Free T4 0.98 0.60 - 1.60 ng/dL      Assessment & Plan:    Routine Health Maintenance and Physical Exam  Immunization History  Administered Date(s) Administered   Influenza,inj,Quad PF,6+ Mos 04/12/2013, 05/10/2020   PFIZER(Purple Top)SARS-COV-2 Vaccination 07/30/2019, 08/20/2020   Tdap 09/11/2022   Health Maintenance  Topic Date Due   Hepatitis C Screening  Never done   PAP SMEAR-Modifier  03/14/2019   COVID-19 Vaccine (3 - 2023-24 season) 03/27/2022   INFLUENZA VACCINE  10/25/2022 (Originally 02/24/2022)   Diabetic kidney evaluation - Urine ACR  09/12/2027 (Originally 10/05/1996)   HEMOGLOBIN A1C  03/12/2023   Diabetic kidney evaluation - eGFR measurement  09/12/2023   DTaP/Tdap/Td (2 - Td or Tdap) 09/11/2032   HIV Screening  Completed   HPV VACCINES  Aged Out   FOOT EXAM  Discontinued   OPHTHALMOLOGY EXAM  Discontinued   Discussed health benefits of physical activity, and encouraged her to engage in regular exercise appropriate for her age and condition.  Problem List Items Addressed This Visit       Other   Class 3 severe obesity due to excess calories with serious comorbidity and body mass index (BMI) of 50.0 to 59.9 in adult [facility])   History of pulmonary embolus (PE)    Unprovoked, negative hypercoagulation work up Repeat CT chest: PE resolved Completed eliquis use x 82month      Hx of Hashimoto thyroiditis    Repeat TSH and T4 No palpable goiter or nodule      Relevant Orders   TSH (Completed)   T4, free (Completed)   Hyperglycemia    Repeat hgbA1c Hx of gestational Dm      Relevant Orders   Hemoglobin A1c (Completed)   Palpitations    Had eval by cardiology Long term holter monitor: NSR with occasional PVCs Metoprolol prescribed, but she did not take me She reports symptoms resolved spontaneously       Vitamin D deficiency   Relevant Orders   VITAMIN D 25 Hydroxy (Vit-D Deficiency, Fractures) (Completed)   Other Visit Diagnoses     Encounter for preventative adult health care exam with abnormal findings    -  Primary   Relevant Orders   Comprehensive metabolic panel (Completed)   Lipid panel (Completed)   Encounter for lipid screening for cardiovascular disease       Relevant Orders   Lipid panel (Completed)   Immunization due       Relevant Orders  Tdap vaccine greater than or equal to 7yo IM (Completed)      Return in about 1 year (around 09/12/2023) for CPE (fasting).     Wilfred Lacy, NP

## 2022-09-11 NOTE — Assessment & Plan Note (Addendum)
Unprovoked, negative hypercoagulation work up Repeat CT chest: PE resolved Completed eliquis use x 34month

## 2022-09-11 NOTE — Assessment & Plan Note (Signed)
Had eval by cardiology Long term holter monitor: NSR with occasional PVCs Metoprolol prescribed, but she did not take me She reports symptoms resolved spontaneously

## 2022-09-11 NOTE — Assessment & Plan Note (Signed)
Repeat hgbA1c Hx of gestational Dm

## 2022-09-11 NOTE — Assessment & Plan Note (Addendum)
Repeat TSH and T4 No palpable goiter or nodule

## 2022-09-23 ENCOUNTER — Encounter: Payer: Self-pay | Admitting: Nurse Practitioner

## 2022-09-29 ENCOUNTER — Encounter: Payer: Self-pay | Admitting: Nurse Practitioner

## 2022-09-29 ENCOUNTER — Telehealth (INDEPENDENT_AMBULATORY_CARE_PROVIDER_SITE_OTHER): Payer: BC Managed Care – PPO | Admitting: Nurse Practitioner

## 2022-09-29 ENCOUNTER — Telehealth: Payer: Self-pay | Admitting: Nurse Practitioner

## 2022-09-29 DIAGNOSIS — Z9189 Other specified personal risk factors, not elsewhere classified: Secondary | ICD-10-CM | POA: Diagnosis not present

## 2022-09-29 MED ORDER — OSELTAMIVIR PHOSPHATE 75 MG PO CAPS: 75.0000 mg | ORAL_CAPSULE | Freq: Every day | ORAL | 0 refills | Status: DC

## 2022-09-29 NOTE — Telephone Encounter (Signed)
Caller Name: Charleigh Call back phone #: [phone]  Reason for Call: Pts son just tested pos for flu. His Dr recommended that she ask you for Tamaflu. Does she need an appt or can it be called in. Please call.

## 2022-09-29 NOTE — Progress Notes (Signed)
Virtual Visit via Video Note  I connected withNAME@ on 09/29/22 at  2:00 PM EST by a video enabled telemedicine application and verified that I am speaking with the correct person using two identifiers.  Location: Patient:Home Provider: Office Participants: patient and provider  I discussed the limitations of evaluation and management by telemedicine and the availability of in person appointments. I also discussed with the patient that there may be a patient responsible charge related to this service. The patient expressed understanding and agreed to proceed.  CC: influenza exposure  History of Present Illness:   Brittney Lester reports her son tested positive yesterday. She denies any symptoms at this time. She did not get influenza vaccine this season.  Observations/Objective: Physical Exam Constitutional:      General: She is not in acute distress. Cardiovascular:     Pulses: Normal pulses.  Pulmonary:     Effort: Pulmonary effort is normal.  Neurological:     Mental Status: She is alert and oriented to person, place, and time.     Assessment and Plan: Brittney Lester was seen today for influenza.  Diagnoses and all orders for this visit:  At increased risk for exposure to influenza virus -     oseltamivir (TAMIFLU) 75 MG capsule; Take 1 capsule (75 mg total) by mouth daily.   Follow Up Instructions: Advised about possible med side effects. Advised to increased dose to 1tab BID if she develops flu symptoms.  I discussed the assessment and treatment plan with the patient. The patient was provided an opportunity to ask questions and all were answered. The patient agreed with the plan and demonstrated an understanding of the instructions.   The patient was advised to call back or seek an in-person evaluation if the symptoms worsen or if the condition fails to improve as anticipated.  Wilfred Lacy, NP

## 2022-09-29 NOTE — Telephone Encounter (Signed)
Brittney Lester has been added to the schedule

## 2023-01-12 ENCOUNTER — Other Ambulatory Visit (HOSPITAL_BASED_OUTPATIENT_CLINIC_OR_DEPARTMENT_OTHER): Payer: Self-pay | Admitting: Nurse Practitioner

## 2023-01-12 ENCOUNTER — Other Ambulatory Visit (HOSPITAL_COMMUNITY)
Admission: RE | Admit: 2023-01-12 | Discharge: 2023-01-12 | Disposition: A | Discharge status: Home / Self Care | Payer: BC Managed Care – PPO | Source: Ambulatory Visit | Attending: Nurse Practitioner | Admitting: Nurse Practitioner

## 2023-01-12 ENCOUNTER — Other Ambulatory Visit: Payer: Self-pay | Admitting: Nurse Practitioner

## 2023-01-12 DIAGNOSIS — R87610 Atypical squamous cells of undetermined significance on cytologic smear of cervix (ASC-US): Secondary | ICD-10-CM | POA: Diagnosis not present

## 2023-01-12 DIAGNOSIS — Z1231 Encounter for screening mammogram for malignant neoplasm of breast: Secondary | ICD-10-CM

## 2023-01-14 ENCOUNTER — Ambulatory Visit (HOSPITAL_BASED_OUTPATIENT_CLINIC_OR_DEPARTMENT_OTHER)
Admission: RE | Admit: 2023-01-14 | Discharge: 2023-01-14 | Disposition: A | Discharge status: Home / Self Care | Payer: BC Managed Care – PPO | Source: Ambulatory Visit | Attending: Nurse Practitioner | Admitting: Nurse Practitioner

## 2023-01-14 ENCOUNTER — Encounter (HOSPITAL_BASED_OUTPATIENT_CLINIC_OR_DEPARTMENT_OTHER): Payer: Self-pay

## 2023-01-14 DIAGNOSIS — Z1231 Encounter for screening mammogram for malignant neoplasm of breast: Secondary | ICD-10-CM | POA: Insufficient documentation

## 2023-01-14 LAB — CYTOLOGY - PAP
Comment: NEGATIVE
Diagnosis: NEGATIVE
Diagnosis: REACTIVE
High risk HPV: NEGATIVE

## 2023-03-08 ENCOUNTER — Ambulatory Visit: Payer: BC Managed Care – PPO | Admitting: Nurse Practitioner

## 2023-03-08 ENCOUNTER — Encounter: Payer: Self-pay | Admitting: Nurse Practitioner

## 2023-03-08 VITALS — BP 118/78 | HR 99 | Temp 98.1°F | Resp 16 | Ht 64.0 in | Wt 297.0 lb

## 2023-03-08 DIAGNOSIS — L0100 Impetigo, unspecified: Secondary | ICD-10-CM

## 2023-03-08 MED ORDER — AMOXICILLIN-POT CLAVULANATE 875-125 MG PO TABS
1.0000 | ORAL_TABLET | Freq: Two times a day (BID) | ORAL | 0 refills | Status: DC
Start: 2023-03-08 — End: 2023-10-22

## 2023-03-08 MED ORDER — MUPIROCIN 2 % EX OINT: 1.0000 | TOPICAL_OINTMENT | Freq: Two times a day (BID) | CUTANEOUS | 0 refills | Status: DC

## 2023-03-08 NOTE — Progress Notes (Signed)
                Established Patient Visit  Patient: Brittney Lester   DOB: December 27, 1980   42 y.o. Female  MRN: [id number] Visit Date: 03/08/2023  Subjective:    Chief Complaint  Patient presents with   Rash    Bumps on abdomin and back. For one week.  Son was dx with impetigo    Rash This is a new problem. The current episode started 1 to 4 weeks ago. The problem has been gradually worsening since onset. The affected locations include the torso. The rash is characterized by draining and redness. Associated with: son diagnosed with impetigo last week. Past treatments include antibiotic cream. The treatment provided no relief. There is no history of allergies, asthma, eczema or varicella.   Reviewed medical, surgical, and social history today  Medications: Outpatient Medications Prior to Visit  Medication Sig   albuterol (VENTOLIN HFA) 108 (90 Base) MCG/ACT inhaler Inhale 1 puff into the lungs every 4 (four) hours as needed for wheezing or shortness of breath.    Ascorbic Acid (VITAMIN C PO) Take 1 tablet by mouth at bedtime. Unknown strength   Cholecalciferol (VITAMIN D) 125 MCG (5000 UT) CAPS Take 1 capsule by mouth daily at 12 noon.   Omega-3 Fatty Acids (OMEGA-3 CF PO) Take by mouth.   Probiotic Product (PROBIOTIC BLEND PO) Take by mouth.   valACYclovir (VALTREX) 500 MG tablet Take 500 mg by mouth 2 (two) times daily.   Zinc 50 MG TABS Take 50 mg by mouth at bedtime.   acetaminophen (TYLENOL) 500 MG tablet Take 1,000 mg by mouth every 6 (six) hours as needed for headache (pain). (Patient not taking: Reported on 03/08/2023)   oseltamivir (TAMIFLU) 75 MG capsule Take 1 capsule (75 mg total) by mouth daily. (Patient not taking: Reported on 03/08/2023)   No facility-administered medications prior to visit.   Reviewed past medical and social history.   ROS per HPI above      Objective:  BP 118/78 (BP Location: Left Arm, Patient Position: Sitting, Cuff Size: Large)   Pulse 99   Temp  98.1 F (36.7 C) (Temporal)   Resp 16   Ht 5\' 4"  (1.626 m)   Wt 297 lb (134.7 kg)   SpO2 99%   BMI 50.98 kg/m      Physical Exam Vitals and nursing note reviewed.  Skin:    Findings: Rash present. Rash is pustular.          No results found for any visits on 03/08/23.    Assessment & Plan:    Problem List Items Addressed This Visit   None Visit Diagnoses     Impetigo    -  Primary   Relevant Medications   mupirocin ointment (BACTROBAN) 2 %   amoxicillin-clavulanate (AUGMENTIN) 875-125 MG tablet      Return if symptoms worsen or fail to improve.     [name], NP

## 2023-03-08 NOTE — Patient Instructions (Signed)
Impetigo, Adult Impetigo is an infection of the skin. It commonly occurs in young children, but it can also occur in adults. The infection causes itchy blisters and sores that produce brownish-yellow fluid. As the fluid dries, it forms a thick, honey-colored crust. These skin changes usually occur on the face, but they can also affect other areas of the body. Impetigo usually goes away in 7-10 days with treatment. What are the causes? This condition is caused by two types of bacteria. It may be caused by staphylococci or streptococci bacteria. These bacteria cause impetigo when they get under the surface of the skin. This often happens after some damage to the skin, such as: Cuts, scrapes, or scratches. Rashes. Insect bites, especially when you scratch the area of a bite. Chickenpox or other illnesses that cause open skin sores. Nail biting or chewing. Impetigo can spread easily from one person to another (is contagious). It may be spread through close skin contact or by sharing towels, clothing, or other items that an infected person has touched. Scratching the affected area can cause impetigo to spread to other parts of the body. The bacteria can get under your fingernails and spread when you touch another area of your skin. What increases the risk? The following factors may make you more likely to develop this condition: Playing sports that include skin-to-skin contact with others. Having broken skin, such as from a cut or scrape. Living in an area that has high humidity levels. Having poor hygiene. Having high levels of staphylococci in your nose. Having a condition that weakens the skin integrity, such as: Having a weak body defense system (immune system). Having a skin condition with open sores, such as chickenpox. Having diabetes. What are the signs or symptoms? The main symptom of this condition is small blisters, often on the face around the mouth and nose. In time, the blisters break  open and turn into tiny sores (lesions) with a yellow crust. In some cases, the blisters cause itching or burning. Scratching, irritation, or lack of treatment may cause these small lesions to get larger. Other possible symptoms include: Larger blisters. Pus. Swollen lymph glands. How is this diagnosed? This condition is usually diagnosed during a physical exam. A skin sample or a sample of fluid from a blister may be taken for lab tests that involve growing bacteria (culture test). Lab tests can help confirm the diagnosis or help determine the best treatment. How is this treated? Treatment for this condition depends on the severity of the condition: Mild impetigo can be treated with prescription antibiotic cream. Oral antibiotic medicine may be used in more severe cases. Medicines that reduce itchiness (antihistamines)may also be used. Follow these instructions at home: Medicines Take over-the-counter and prescription medicines only as told by your health care provider. Apply or take your antibiotic as told by your health care provider. Do not stop using the antibiotic even if your condition improves. Before applying antibiotic cream or ointment, you should: Gently wash the infected areas with antibacterial soap and warm water. Soak crusted areas in warm, soapy water using antibacterial soap. Gently rub the areas to remove crusts. Do not scrub. Preventing the spread of infection  To help prevent impetigo from spreading to other body areas: Keep your fingernails short and clean. Do not scratch the blisters or sores. Cover infected areas, if necessary, to keep from scratching. Wash your hands often with soap and warm water. To help prevent impetigo from spreading to other people: Do not share towels.   Wash your clothing and bedsheets in water that is 140F (60C) or warmer. Stay home until you have used an antibiotic cream for 48 hours (2 days) or an oral antibiotic medicine for 24 hours  (1 day). You should only return to work and activities with other people if your skin shows significant improvement. You may return to contact sports after you have used antibiotic medicine for 72 hours (3 days). General instructions Keep all follow-up visits. This is important. How is this prevented? Wash your hands often with soap and warm water. Do not share towels, washcloths, clothing, bedding, or razors. Keep your fingernails short. Keep any cuts, scrapes, bug bites, or rashes clean and covered. Use insect repellent to prevent bug bites. Contact a health care provider if: You develop more blisters or sores, even with treatment. Other family members get sores. Your skin sores are not improving after 72 hours (3 days) of treatment. You have a fever. Get help right away if: You see spreading redness or swelling of the skin around your sores. You develop a sore throat. The area around your rash becomes warm, red, or tender to the touch. You have dark, reddish-brown urine. You do not urinate often or you urinate small amounts. You are very tired (lethargic). You have swelling in your face, hands, or feet. Summary Impetigo is a skin infection that causes itchy blisters and sores that produce brownish-yellow fluid. As the fluid dries, it forms a crust. This condition is caused by staphylococci or streptococci bacteria. These bacteria cause impetigo when they get under the surface of the skin, such as through cuts, rashes, bug bites, or open sores. Treatment for this condition may include antibiotic ointment or oral antibiotics. To help prevent impetigo from spreading to other body areas, make sure you keep your fingernails short, avoid scratching, cover any blisters, and wash your hands often. If you have impetigo, stay home until you have used an antibiotic cream for 48 hours (2 days) or an oral antibiotic medicine for 24 hours (1 day). You should only return to work and activities with  other people if your skin shows significant improvement. This information is not intended to replace advice given to you by your health care provider. Make sure you discuss any questions you have with your health care provider. Document Revised: 12/13/2019 Document Reviewed: 12/13/2019 Elsevier Patient Education  2024 Elsevier Inc.  

## 2023-09-14 ENCOUNTER — Encounter: Payer: BC Managed Care – PPO | Admitting: Nurse Practitioner

## 2023-10-22 ENCOUNTER — Encounter: Payer: Self-pay | Admitting: Nurse Practitioner

## 2023-10-22 ENCOUNTER — Ambulatory Visit (INDEPENDENT_AMBULATORY_CARE_PROVIDER_SITE_OTHER): Payer: Self-pay | Admitting: Nurse Practitioner

## 2023-10-22 VITALS — BP 128/78 | HR 88 | Temp 98.3°F | Ht 64.0 in | Wt 309.0 lb

## 2023-10-22 DIAGNOSIS — Z13220 Encounter for screening for lipoid disorders: Secondary | ICD-10-CM

## 2023-10-22 DIAGNOSIS — Z0001 Encounter for general adult medical examination with abnormal findings: Secondary | ICD-10-CM

## 2023-10-22 DIAGNOSIS — R739 Hyperglycemia, unspecified: Secondary | ICD-10-CM | POA: Diagnosis not present

## 2023-10-22 DIAGNOSIS — E66813 Obesity, class 3: Secondary | ICD-10-CM | POA: Diagnosis not present

## 2023-10-22 DIAGNOSIS — Z1211 Encounter for screening for malignant neoplasm of colon: Secondary | ICD-10-CM

## 2023-10-22 DIAGNOSIS — Z6841 Body Mass Index (BMI) 40.0 and over, adult: Secondary | ICD-10-CM

## 2023-10-22 DIAGNOSIS — Z136 Encounter for screening for cardiovascular disorders: Secondary | ICD-10-CM

## 2023-10-22 NOTE — Progress Notes (Signed)
 Complete physical exam  Patient: Brittney Lester   DOB: 04-Nov-1980   43 y.o. Female  MRN: [id number] Visit Date: 10/22/2023  Subjective:    Chief Complaint  Patient presents with   Annual Exam   Brittney Lester is a 43 y.o. female who presents today for a complete physical exam. She reports consuming a low fat and low sodium diet.  Walking daily  She generally feels fairly well. She reports sleeping poorly. She does have additional problems to discuss today.  Vision:No Dental:Yes STD Screen:No  BP Readings from Last 3 Encounters:  10/22/23 128/78  03/08/23 118/78  09/11/22 102/80   Wt Readings from Last 3 Encounters:  10/22/23 (!) 309 lb (140.2 kg)  03/08/23 297 lb (134.7 kg)  09/11/22 297 lb (134.7 kg)    Most recent fall risk assessment:    10/22/2023    1:12 PM  Fall Risk   Number falls in past yr: 0  Injury with Fall? 0  Risk for fall due to : No Fall Risks  Follow up Falls evaluation completed   Depression screen:Yes - Depression Most recent depression screenings:    10/22/2023    1:12 PM 09/11/2022    1:01 PM  PHQ 2/9 Scores  PHQ - 2 Score 0 0  PHQ- 9 Score 2    HPI  Class 3 severe obesity due to excess calories with serious comorbidity and body mass index (BMI) of 50.0 to 59.9 in adult [facility]) Encouraged to maintain heart healthy diet and daily exercise. Provided printed information She declined referral to nutritionist at this time Wt Readings from Last 3 Encounters:  10/22/23 (!) 309 lb (140.2 kg)  03/08/23 297 lb (134.7 kg)  09/11/22 297 lb (134.7 kg)    F/up in 3months  Past Medical History:  Diagnosis Date   Acute respiratory failure with hypoxia (HCC) 04/13/2020   Anxiety    COVID-19    COVID-19 virus infection 04/13/2020   Depression    Depression    Diabetes mellitus without complication (HCC)    gestational   Excessive and frequent menstruation 07/22/2016   Family history of adverse reaction to anesthesia    mother has nausea post surgery    Fatigue    Graves disease    no longer need medication treatment   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    Joint pain    Multifocal pneumonia    Obesity    Palpitations    Prediabetes    Swelling    Past Surgical History:  Procedure Laterality Date   COLPOSCOPY     DILITATION & CURRETTAGE/HYSTROSCOPY WITH HYDROTHERMAL ABLATION N/A 07/22/2016   Procedure: DILATATION & CURETTAGE/HYSTEROSCOPY WITH HYDROTHERMAL ABLATION;  Surgeon: Gerald Leitz, MD;  Location: [facility];  Service: Gynecology;  Laterality: N/A;   LAPAROSCOPIC TUBAL LIGATION Bilateral 07/22/2016   Procedure: LAPAROSCOPIC TUBAL LIGATION;  Surgeon: Gerald Leitz, MD;  Location: [facility];  Service: Gynecology;  Laterality: Bilateral;   TONSILLECTOMY     TUBAL LIGATION  2017   Social History   Socioeconomic History   Marital status: Divorced    Spouse name: Not on file   Number of children: 3   Years of education: Not on file   Highest education level: Master's degree (e.g., MA, MS, MEng, MEd, MSW, MBA)  Occupational History   Occupation: Doctor, general practice  Tobacco Use   Smoking status: Never   Smokeless tobacco: Never  Vaping Use   Vaping status: Never Used  Substance  and Sexual Activity   Alcohol use: Not Currently    Alcohol/week: 1.0 standard drink of alcohol    Types: 1 Standard drinks or equivalent per week    Comment: occ   Drug use: No   Sexual activity: Yes    Birth control/protection: Condom    Comment: Ablation  Other Topics Concern   Not on file  Social History Narrative   Not on file   Social Drivers of Health   Financial Resource Strain: Low Risk  (10/21/2023)   Overall Financial Resource Strain (CARDIA)    Difficulty of Paying Living Expenses: Not very hard  Food Insecurity: No Food Insecurity (10/21/2023)   Hunger Vital Sign    Worried About Running Out of Food in the Last Year: Never true    Ran Out of Food in the Last Year: Never true  Transportation Needs: No Transportation Needs (10/21/2023)    PRAPARE - Administrator, Civil Service (Medical): No    Lack of Transportation (Non-Medical): No  Physical Activity: Insufficiently Active (10/21/2023)   Exercise Vital Sign    Days of Exercise per Week: 2 days    Minutes of Exercise per Session: 20 min  Stress: Stress Concern Present (10/21/2023)   [employer] of Occupational Health - Occupational Stress Questionnaire    Feeling of Stress : To some extent  Social Connections: Moderately Integrated (10/21/2023)   Social Connection and Isolation Panel [NHANES]    Frequency of Communication with Friends and Family: More than three times a week    Frequency of Social Gatherings with Friends and Family: Once a week    Attends Religious Services: More than 4 times per year    Active Member of Clubs or Organizations: Yes    Attends Engineer, structural: More than 4 times per year    Marital Status: Divorced  Catering manager Violence: Not on file   Family Status  Relation Name Status   Mother Brittney Lester (Not Specified)   Father Brittney Lester (Not Specified)   MGF Brittney Lester (Not Specified)   Other  (Not Specified)  No partnership data on file   Family History  Problem Relation Age of Onset   Hypertension Mother    Thyroid disease Mother    High Cholesterol Mother    Obesity Mother    High blood pressure Father    High Cholesterol Father    Obesity Father    Myasthenia gravis Father 89   Heart disease Maternal Grandfather    Hypertension Other    No Known Allergies  Patient Care Team: Rhena Glace, Bonna Gains, NP as PCP - General (Internal Medicine) Gerald Leitz, MD as Consulting Physician (Obstetrics and Gynecology)   Medications: Outpatient Medications Prior to Visit  Medication Sig   Ascorbic Acid (VITAMIN C PO) Take 1 tablet by mouth at bedtime. Unknown strength   Cholecalciferol (VITAMIN D) 125 MCG (5000 UT) CAPS Take 1 capsule by mouth daily at 12 noon.   Omega-3 Fatty Acids (OMEGA-3 CF PO) Take by mouth.    Probiotic Product (PROBIOTIC BLEND PO) Take by mouth.   valACYclovir (VALTREX) 500 MG tablet Take 500 mg by mouth 2 (two) times daily.   Zinc 50 MG TABS Take 50 mg by mouth at bedtime.   [DISCONTINUED] acetaminophen (TYLENOL) 500 MG tablet Take 1,000 mg by mouth every 6 (six) hours as needed for headache (pain).   [DISCONTINUED] albuterol (VENTOLIN HFA) 108 (90 Base) MCG/ACT inhaler Inhale 1 puff into the lungs every 4 (four) hours  as needed for wheezing or shortness of breath.    [DISCONTINUED] amoxicillin-clavulanate (AUGMENTIN) 875-125 MG tablet Take 1 tablet by mouth 2 (two) times daily.   [DISCONTINUED] mupirocin ointment (BACTROBAN) 2 % Apply 1 Application topically 2 (two) times daily.   [DISCONTINUED] oseltamivir (TAMIFLU) 75 MG capsule Take 1 capsule (75 mg total) by mouth daily.   No facility-administered medications prior to visit.   Review of Systems  Constitutional:  Negative for activity change, appetite change and unexpected weight change.  Respiratory: Negative.    Cardiovascular: Negative.   Gastrointestinal: Negative.   Endocrine: Negative for cold intolerance and heat intolerance.  Genitourinary: Negative.   Musculoskeletal: Negative.   Skin: Negative.   Neurological: Negative.   Hematological: Negative.   Psychiatric/Behavioral:  Positive for sleep disturbance. Negative for behavioral problems, decreased concentration, dysphoric mood, hallucinations, self-injury and suicidal ideas. The patient is not nervous/anxious.        Objective:  BP 128/78 (BP Location: Left Arm, Patient Position: Sitting, Cuff Size: Large)   Pulse 88   Temp 98.3 F (36.8 C) (Temporal)   Ht 5\' 4"  (1.626 m)   Wt (!) 309 lb (140.2 kg)   SpO2 98%   BMI 53.04 kg/m     Physical Exam Vitals and nursing note reviewed.  Constitutional:      General: She is not in acute distress. HENT:     Right Ear: Tympanic membrane, ear canal and external ear normal.     Left Ear: Tympanic membrane, ear  canal and external ear normal.     Nose: Nose normal.  Eyes:     Extraocular Movements: Extraocular movements intact.     Conjunctiva/sclera: Conjunctivae normal.     Pupils: Pupils are equal, round, and reactive to light.  Neck:     Thyroid: No thyroid mass, thyromegaly or thyroid tenderness.  Cardiovascular:     Rate and Rhythm: Normal rate and regular rhythm.     Pulses: Normal pulses.     Heart sounds: Normal heart sounds.  Pulmonary:     Effort: Pulmonary effort is normal.     Breath sounds: Normal breath sounds.  Abdominal:     General: Bowel sounds are normal.     Palpations: Abdomen is soft.  Musculoskeletal:        General: Normal range of motion.     Cervical back: Normal range of motion and neck supple.     Right lower leg: No edema.     Left lower leg: No edema.  Lymphadenopathy:     Cervical: No cervical adenopathy.  Skin:    General: Skin is warm and dry.  Neurological:     Mental Status: She is alert and oriented to person, place, and time.     Cranial Nerves: No cranial nerve deficit.  Psychiatric:        Mood and Affect: Mood normal.        Behavior: Behavior normal.        Thought Content: Thought content normal.     No results found for any visits on 10/22/23.    Assessment & Plan:    Routine Health Maintenance and Physical Exam  Immunization History  Administered Date(s) Administered   Dtap, Unspecified 12/13/1978, 02/14/1979, 05/19/1979, 07/30/1980   Influenza,inj,Quad PF,6+ Mos 04/12/2013, 05/10/2020   MMR 01/12/1980   PFIZER(Purple Top)SARS-COV-2 Vaccination 07/30/2019, 07/29/2020, 08/20/2020   Polio, Unspecified 12/13/1978, 02/14/1979, 05/19/1979, 07/30/1980   Tdap 09/11/2022    Health Maintenance  Topic Date Due   Hepatitis  C Screening  Never done   Colonoscopy  Never done   INFLUENZA VACCINE  10/25/2023 (Originally 02/25/2023)   COVID-19 Vaccine (4 - 2024-25 season) 11/07/2023 (Originally 03/28/2023)   Cervical Cancer Screening (HPV/Pap  Cotest)  01/12/2028   DTaP/Tdap/Td (6 - Td or Tdap) 09/11/2032   HIV Screening  Completed   HPV VACCINES  Aged Out   Discussed health benefits of physical activity, and encouraged her to engage in regular exercise appropriate for her age and condition.  Problem List Items Addressed This Visit     Class 3 severe obesity due to excess calories with serious comorbidity and body mass index (BMI) of 50.0 to 59.9 in adult [facility])   Encouraged to maintain heart healthy diet and daily exercise. Provided printed information She declined referral to nutritionist at this time Wt Readings from Last 3 Encounters:  10/22/23 (!) 309 lb (140.2 kg)  03/08/23 297 lb (134.7 kg)  09/11/22 297 lb (134.7 kg)    F/up in 3months      Relevant Orders   TSH   Lipid panel   Hyperglycemia   Relevant Orders   Hemoglobin A1c   Other Visit Diagnoses       Encounter for preventative adult health care exam with abnormal findings    -  Primary   Relevant Orders   Comprehensive metabolic panel with GFR   CBC     Encounter for lipid screening for cardiovascular disease       Relevant Orders   Lipid panel     Brittney cancer screening       Relevant Orders   Ambulatory referral to Gastroenterology      Return in about 3 months (around 01/22/2024) for weight management and sleep disturbance.     [name], NP

## 2023-10-22 NOTE — Patient Instructions (Signed)
 Schedule fasting lab appt. Need to be fasting 8hrs prior to blood draw. Ok to drink water and take BP meds. Maintain Heart healthy diet and daily exercise. Schedule appointment for annual mammogram  Preventing Unhealthy Weight Gain, Adult Staying at a healthy weight is important to your overall health. When fat builds up in your body, you may become overweight or obese. Being overweight or obese increases your risk of developing various health problems. Unhealthy weight gain is often the result of making unhealthy food choices or not getting enough exercise. You can make changes to your lifestyle to prevent obesity and stay as healthy as possible. How can unhealthy weight gain affect me? Being overweight or obese can cause you to develop joint or bone problems, which can make it hard for you to stay active or do activities you enjoy. Being overweight also puts stress on your heart and lungs and can lead to health problems such as: Diabetes. Heart disease. Some types of cancer. Stroke. Eating healthy, staying active, and having healthy habits can help to prevent unhealthy weight gain and lower your risk for health problems. These lifestyle changes will also help you manage stress and emotions, improve your self-esteem, and connect with friends and family. What can increase my risk? In addition to certain eating and lifestyle choices, some other factors that may make you more likely to have unhealthy weight gain include: Having a family history of obesity. Living in an area with limited access to: [place], recreation centers, or sidewalks. Healthy food choices, such as grocery stores and farmers' markets. What actions can I take to prevent unhealthy weight gain? Nutrition  Eat only as much as your body needs. To do this: Pay attention to signs that you are hungry or full. Stop eating as soon as you feel full. If you feel hungry, try drinking water first before eating. Drink enough water so your  urine is pale yellow. Eat smaller portions. Pay attention to portion sizes when eating out. Look at serving sizes on food labels. Most foods contain more than one serving per container. Eat the recommended number of calories for your gender and activity level. For most active people, a daily total of 2,000 calories is appropriate. If you are trying to lose weight or are not very active, you may need to eat fewer calories. Talk with your health care provider or a dietitian about how many calories you need each day. Choose healthy foods, such as: Fruits and vegetables. At each meal, try to fill at least half of your plate with fruits and vegetables. Whole grains, such as whole-wheat bread, brown rice, and quinoa. Lean meats, such as chicken or fish. Other healthy proteins, such as beans, eggs, or tofu. Healthy fats, such as nuts, seeds, fatty fish, and olive oil. Low-fat or fat-free dairy products. Check food labels, and avoid food and drinks that: Are high in calories. Have added sugar. Are high in sodium. Have saturated fats or trans fats. Cook foods in healthier ways, such as by baking, broiling, or grilling. Make a meal plan for the week, and shop with a grocery list to help you stay on track with your purchases. Try to avoid going to the grocery store when you are hungry. When grocery shopping, try to shop around the outside of the store first, where the fresh foods are. Doing this helps you avoid prepackaged foods, which can be high in sugar, salt (sodium), and fat. Lifestyle  Exercise for 30 or more minutes on 5 or  more days each week. Exercising may include brisk walking, yard work, biking, running, swimming, and team sports like basketball and soccer. Ask your health care provider which exercises are safe for you. Do activities that strengthen the muscles, such as lifting weights or using resistance bands, on 2 or more days a week. Do not use any products that contain nicotine or  tobacco. These products include cigarettes, chewing tobacco, and vaping devices, such as e-cigarettes. If you need help quitting, ask your health care provider. If you drink alcohol: Limit how much you have to: 0-1 drink a day for women who are not pregnant. 0-2 drinks a day for men. Know how much alcohol is in a drink. In the [place] one drink equals one 12 oz bottle of beer (355 mL), one 5 oz glass of wine (148 mL), or one 1 oz glass of hard liquor (44 mL). Try to get 7-9 hours of sleep each night. Other changes Keep a food and activity journal to keep track of: What you ate and how many calories you had. Remember to count the calories in sauces, dressings, and side dishes. Whether you were active, and what exercises you did. Your calorie, weight, and activity goals. Check your weight regularly. Track any changes. If you notice that you have gained weight, make changes to your diet or activity routine. Avoid taking weight-loss medicines or supplements. Talk to your health care provider before starting any new medicine or supplement. Talk to your health care provider before trying any new diet or exercise plan. Where to find more information Talk with your health care provider or a dietitian about healthy eating and healthy lifestyle choices. You may also find information from: [place]. Department of Agriculture, MyPlate: https://ball-collins.biz/ American Heart Association: www.heart.org Centers for Disease Control and Prevention: FootballExhibition.com.br Summary Eating healthy, staying active, and having healthy habits can help to prevent unhealthy weight gain and lower your risk for health problems such as heart disease, diabetes, some types of cancer, and stroke. Being overweight or obese can cause you to develop joint or bone problems, which can make it hard for you to stay active or do activities you enjoy. You can prevent unhealthy weight gain by eating a healthy diet, exercising regularly, not smoking,  limiting alcohol, and getting enough sleep. Talk with your health care provider or a dietitian for guidance about healthy eating and healthy lifestyle choices. This information is not intended to replace advice given to you by your health care provider. Make sure you discuss any questions you have with your health care provider. Document Revised: 02/07/2021 Document Reviewed: 02/07/2021 Elsevier Patient Education  2024 [employer].

## 2023-10-22 NOTE — Assessment & Plan Note (Signed)
 Encouraged to maintain heart healthy diet and daily exercise. Provided printed information She declined referral to nutritionist at this time Wt Readings from Last 3 Encounters:  10/22/23 (!) 309 lb (140.2 kg)  03/08/23 297 lb (134.7 kg)  09/11/22 297 lb (134.7 kg)    F/up in 3months

## 2023-10-29 ENCOUNTER — Other Ambulatory Visit

## 2023-10-29 ENCOUNTER — Encounter: Payer: Self-pay | Admitting: Nurse Practitioner

## 2023-10-29 DIAGNOSIS — Z13220 Encounter for screening for lipoid disorders: Secondary | ICD-10-CM | POA: Diagnosis not present

## 2023-10-29 DIAGNOSIS — E66813 Obesity, class 3: Secondary | ICD-10-CM | POA: Diagnosis not present

## 2023-10-29 DIAGNOSIS — R739 Hyperglycemia, unspecified: Secondary | ICD-10-CM | POA: Diagnosis not present

## 2023-10-29 DIAGNOSIS — Z0001 Encounter for general adult medical examination with abnormal findings: Secondary | ICD-10-CM

## 2023-10-29 DIAGNOSIS — Z6841 Body Mass Index (BMI) 40.0 and over, adult: Secondary | ICD-10-CM

## 2023-10-29 DIAGNOSIS — Z136 Encounter for screening for cardiovascular disorders: Secondary | ICD-10-CM

## 2023-10-29 LAB — COMPREHENSIVE METABOLIC PANEL WITH GFR
ALT: 23 U/L (ref 0–35)
AST: 18 U/L (ref 0–37)
Albumin: 4.6 g/dL (ref 3.5–5.2)
Alkaline Phosphatase: 62 U/L (ref 39–117)
BUN: 20 mg/dL (ref 6–23)
CO2: 28 meq/L (ref 19–32)
Calcium: 9.1 mg/dL (ref 8.4–10.5)
Chloride: 104 meq/L (ref 96–112)
Creatinine, Ser: 0.9 mg/dL (ref 0.40–1.20)
GFR: 77.45 mL/min (ref >60.00)
Glucose, Bld: 91 mg/dL (ref 70–99)
Potassium: 3.9 meq/L (ref 3.5–5.1)
Sodium: 141 meq/L (ref 135–145)
Total Bilirubin: 1.3 mg/dL — ABNORMAL HIGH (ref 0.2–1.2)
Total Protein: 7.4 g/dL (ref 6.0–8.3)

## 2023-10-29 LAB — LIPID PANEL
Cholesterol: 150 mg/dL (ref 0–200)
HDL: 45.3 mg/dL (ref >39.00)
LDL Cholesterol: 85 mg/dL (ref 0–99)
NonHDL: 104.72
Total CHOL/HDL Ratio: 3
Triglycerides: 97 mg/dL (ref 0.0–149.0)
VLDL: 19.4 mg/dL (ref 0.0–40.0)

## 2023-10-29 LAB — CBC
HCT: 41.4 % (ref 36.0–46.0)
Hemoglobin: 14.1 g/dL (ref 12.0–15.0)
MCHC: 34 g/dL (ref 30.0–36.0)
MCV: 89.3 fl (ref 78.0–100.0)
Platelets: 182 10*3/uL (ref 150.0–400.0)
RBC: 4.64 Mil/uL (ref 3.87–5.11)
RDW: 14.3 % (ref 11.5–15.5)
WBC: 6.6 10*3/uL (ref 4.0–10.5)

## 2023-10-29 LAB — HEMOGLOBIN A1C: Hgb A1c MFr Bld: 4.9 % (ref 4.6–6.5)

## 2023-10-29 LAB — TSH: TSH: 0.88 u[IU]/mL (ref 0.35–5.50)

## 2023-11-04 ENCOUNTER — Encounter: Payer: Self-pay | Admitting: Internal Medicine

## 2023-12-16 ENCOUNTER — Telehealth: Payer: Self-pay

## 2023-12-16 NOTE — Telephone Encounter (Signed)
 Called and spoke with patient- patient advised of Dr. Alberta Almond request;  patient has been scheduled for an OV with an APP;  PV and [facility] procedures have been cancelled;

## 2023-12-16 NOTE — Telephone Encounter (Signed)
 Routine office visit to discuss screening options in this high risk patient

## 2023-12-16 NOTE — Telephone Encounter (Signed)
 Dr. Elvin Hammer, It appears that this patient's most current BMI=53.04 (as of 10/22/2023).  Please advise if you would like this patient to have an OV or if she can be a direct at the hospital.  Please/thank you Bre, PV RN

## 2023-12-27 ENCOUNTER — Encounter

## 2024-01-12 ENCOUNTER — Encounter: Admitting: Internal Medicine

## 2024-01-12 ENCOUNTER — Ambulatory Visit: Admitting: Physician Assistant

## 2024-01-24 ENCOUNTER — Ambulatory Visit (INDEPENDENT_AMBULATORY_CARE_PROVIDER_SITE_OTHER): Admitting: Nurse Practitioner

## 2024-01-24 ENCOUNTER — Encounter: Payer: Self-pay | Admitting: Nurse Practitioner

## 2024-01-24 VITALS — BP 124/78 | HR 87 | Temp 98.2°F | Ht 64.0 in | Wt 303.2 lb

## 2024-01-24 DIAGNOSIS — E66813 Obesity, class 3: Secondary | ICD-10-CM | POA: Diagnosis not present

## 2024-01-24 DIAGNOSIS — Z6841 Body Mass Index (BMI) 40.0 and over, adult: Secondary | ICD-10-CM

## 2024-01-24 DIAGNOSIS — F3342 Major depressive disorder, recurrent, in full remission: Secondary | ICD-10-CM

## 2024-01-24 DIAGNOSIS — E041 Nontoxic single thyroid nodule: Secondary | ICD-10-CM | POA: Insufficient documentation

## 2024-01-24 NOTE — Assessment & Plan Note (Addendum)
 Incidental finding on exam today, left side No dysphagia or hoarseness or sorethroat.  Ordered thyroid  US 

## 2024-01-24 NOTE — Assessment & Plan Note (Signed)
 Denies need for CBT and med at this time

## 2024-01-24 NOTE — Patient Instructions (Signed)
 Maintain vit. D 1000IU daily and calcium 600mg  daily Maintain heart healthy diet and daily exercise. Measure weight, neck, chest, waist, arms, and thigh circumference every 2weeks.  How to Increase Your Level of Physical Activity Getting regular physical activity is important for your overall health and well-being. Most people do not get enough exercise. There are easy ways to increase your level of physical activity, even if you have not been very active in the past or if you are just starting out. What are the benefits of physical activity? Physical activity has many short-term and long-term benefits. Being active on a regular basis can improve your physical and mental health as well as provide other benefits. Physical health benefits Helping you lose weight or maintain a healthy weight. Strengthening your muscles and bones. Reducing your risk of certain long-term (chronic) diseases, including heart disease, cancer, and diabetes. Being able to move around more easily and for longer periods of time without getting tired (increased endurance or stamina). Improving your ability to fight off illness (enhanced immunity). Being able to sleep better. Helping you stay healthy as you get older, including: Helping you stay mobile, or capable of walking and moving around. Preventing accidents, such as falls. Increasing life expectancy. Mental health benefits Boosting your mood and improving your self-esteem. Lowering your chance of having mental health problems, such as depression or anxiety. Helping you feel good about your body. Other benefits Finding new sources of fun and enjoyment. Meeting new people who share a common interest. Before you begin If you have a chronic illness or have not been active for a while, check with your health care provider about how to get started. Ask your health care provider what activities are safe for you. Start out slowly. Walking or doing some simple chair  exercises is a good place to start, especially if you have not been active before or for a long time. Set goals that you can work toward. Ask your health care provider how much exercise is best for you. In general, most adults should: Do moderate-intensity exercise for at least 150 minutes each week (30 minutes on most days of the week) or vigorous exercise for at least 75 minutes each week, or a combination of these. Moderate-intensity exercise can include walking at a quick pace, biking, yoga, water aerobics, or gardening. Vigorous exercise involves activities that take more effort, such as jogging or running, playing sports, swimming laps, or jumping rope. Do strength exercises on at least 2 days each week. This can include weight lifting, body weight exercises, and resistance-band exercises. How to be more physically active Make a plan  Try to find activities that you enjoy. You are more likely to commit to an exercise routine if it does not feel like a chore. If you have bone or joint problems, choose low-impact exercises, like walking or swimming. Use these tips for being successful with an exercise plan: Find a workout partner for accountability. Join a group or class, such as an aerobics class, cycling class, or sports team. Make family time active. Go for a walk, bike, or swim. Include a variety of exercises each week. Consider using a fitness tracker, such as a mobile phone app or a device worn like a watch, that will count the number of steps you take each day. Many people strive to reach 10,000 steps a day. Find ways to be active in your daily routines Besides your formal exercise plans, you can find ways to do physical activity during your  daily routines, such as: Walking or biking to work or to the store. Taking the stairs instead of the elevator. Parking farther away from the door at work or at the store. Planning walking meetings. Walking around while you are on the  phone. Where to find more information Centers for Disease Control and Prevention: CampusCasting.com.pt President's Council on Fitness, Sports & Nutrition: www.fitness.gov ChooseMyPlate: http://www.harvey.com/ Contact a health care provider if: You have headaches, muscle aches, or joint pain that is concerning. You feel dizzy or light-headed while exercising. You faint. You feel your heart skipping, racing, or fluttering. You have chest pain while exercising. Summary Exercise benefits your mind and body at any age, even if you are just starting out. If you have a chronic illness or have not been active for a while, check with your health care provider before increasing your physical activity. Choose activities that are safe and enjoyable for you. Ask your health care provider what activities are safe for you. Start slowly. Tell your health care provider if you have problems as you start to increase your activity level. This information is not intended to replace advice given to you by your health care provider. Make sure you discuss any questions you have with your health care provider. Document Revised: 11/08/2020 Document Reviewed: 11/08/2020 Elsevier Patient Education  2024 [employer].

## 2024-01-24 NOTE — Assessment & Plan Note (Signed)
 Reports resolved sleep disturbance and dyspnea with daily exercise. Exercise: video aerobic exercise and pilates daily Diet: low fat, low carb, but not consistent due to work schedule and [employer] volunteering. Lost 6lbs in last 3months Wt Readings from Last 3 Encounters:  01/24/24 (!) 303 lb 3.2 oz (137.5 kg)  10/22/23 (!) 309 lb (140.2 kg)  03/08/23 297 lb (134.7 kg)    She is not interested in use of medication at this time. Encouraged to maintain a heart healthy diet, and daily exercise. F/up in 3months

## 2024-01-24 NOTE — Progress Notes (Signed)
 Established Patient Visit  Patient: Brittney Lester   DOB: 27-Sep-1980   43 y.o. Female  MRN: [id number] Visit Date: 01/24/2024  Subjective:    Chief Complaint  Patient presents with   Follow-up    3 month follow up for weight management and sleep- disturbance-sleep has improved    HPI Depression, recurrent (HCC) Denies need for CBT and med at this time  Class 3 severe obesity due to excess calories with serious comorbidity and body mass index (BMI) of 50.0 to 59.9 in adult [place]) Reports resolved sleep disturbance and dyspnea with daily exercise. Exercise: video aerobic exercise and pilates daily Diet: low fat, low carb, but not consistent due to work schedule and [employer] volunteering. Lost 6lbs in last 3months Wt Readings from Last 3 Encounters:  01/24/24 (!) 303 lb 3.2 oz (137.5 kg)  10/22/23 (!) 309 lb (140.2 kg)  03/08/23 297 lb (134.7 kg)    She is not interested in use of medication at this time. Encouraged to maintain a heart healthy diet, and daily exercise. F/up in 3months  Thyroid  nodule Incidental finding on exam today, left side No dysphagia or hoarseness or sorethroat.  Ordered thyroid  US   She has upcoming appointment with GI for colonoscopy      10/22/2023    1:12 PM 09/11/2022    1:01 PM 01/17/2021    9:34 AM  Depression screen PHQ 2/9  Decreased Interest 0 0 0  Down, Depressed, Hopeless 0 0 0  PHQ - 2 Score 0 0 0  Altered sleeping 0  0  Tired, decreased energy 1  1  Change in appetite 0  1  Feeling bad or failure about yourself  0  0  Trouble concentrating 1  1  Moving slowly or fidgety/restless 0  0  Suicidal thoughts 0  0  PHQ-9 Score 2  3  Difficult doing work/chores Not difficult at all  Not difficult at all       10/22/2023    1:12 PM 01/17/2021    9:34 AM 10/18/2020   10:37 AM  GAD 7 : Generalized Anxiety Score  Nervous, Anxious, on Edge 0 0 0  Control/stop worrying 0 0 0  Worry too much - different things 0 0 0  Trouble  relaxing 0 0 1  Restless 1 0 0  Easily annoyed or irritable 1 1 1   Afraid - awful might happen 0 0 0  Total GAD 7 Score 2 1 2   Anxiety Difficulty Not difficult at all Not difficult at all Somewhat difficult     Reviewed medical, surgical, and social history today  Medications: Outpatient Medications Prior to Visit  Medication Sig   Ascorbic Acid (VITAMIN C PO) Take 1 tablet by mouth at bedtime. Unknown strength   Cholecalciferol (VITAMIN D ) 125 MCG (5000 UT) CAPS Take 1 capsule by mouth daily at 12 noon.   Omega-3 Fatty Acids (OMEGA-3 CF PO) Take by mouth.   Probiotic Product (PROBIOTIC BLEND PO) Take by mouth.   valACYclovir (VALTREX) 500 MG tablet Take 500 mg by mouth 2 (two) times daily.   Zinc 50 MG TABS Take 50 mg by mouth at bedtime.   No facility-administered medications prior to visit.   Reviewed past medical and social history.   ROS per HPI above  Last CBC Lab Results  Component Value Date   WBC 6.6 10/29/2023   HGB 14.1 10/29/2023   HCT  41.4 10/29/2023   MCV 89.3 10/29/2023   MCH 30.5 06/03/2021   RDW 14.3 10/29/2023   PLT 182.0 10/29/2023   Last metabolic panel Lab Results  Component Value Date   GLUCOSE 91 10/29/2023   NA 141 10/29/2023   K 3.9 10/29/2023   CL 104 10/29/2023   CO2 28 10/29/2023   BUN 20 10/29/2023   CREATININE 0.90 10/29/2023   GFR 77.45 10/29/2023   CALCIUM 9.1 10/29/2023   PHOS 3.7 04/16/2020   PROT 7.4 10/29/2023   ALBUMIN 4.6 10/29/2023   LABGLOB 2.4 02/21/2020   AGRATIO 2.0 02/21/2020   BILITOT 1.3 (H) 10/29/2023   ALKPHOS 62 10/29/2023   AST 18 10/29/2023   ALT 23 10/29/2023   ANIONGAP 8 06/03/2021   Last lipids Lab Results  Component Value Date   CHOL 150 10/29/2023   HDL 45.30 10/29/2023   LDLCALC 85 10/29/2023   TRIG 97.0 10/29/2023   CHOLHDL 3 10/29/2023   Last hemoglobin A1c Lab Results  Component Value Date   HGBA1C 4.9 10/29/2023   Last thyroid  functions Lab Results  Component Value Date   TSH 0.88  10/29/2023   T3TOTAL 125 02/21/2020   T4TOTAL 8.8 01/17/2021   Last vitamin D  Lab Results  Component Value Date   VD25OH 37.75 09/11/2022        Objective:  BP 124/78 (BP Location: Left Arm, Patient Position: Sitting, Cuff Size: Large)   Pulse 87   Temp 98.2 F (36.8 C) (Oral)   Ht 5' 4 (1.626 m)   Wt (!) 303 lb 3.2 oz (137.5 kg)   SpO2 98%   BMI 52.04 kg/m      Physical Exam Vitals and nursing note reviewed.  Neck:     Thyroid : Thyroid  mass present. No thyromegaly or thyroid  tenderness.   Cardiovascular:     Rate and Rhythm: Normal rate and regular rhythm.     Pulses: Normal pulses.     Heart sounds: Normal heart sounds.  Pulmonary:     Effort: Pulmonary effort is normal.     Breath sounds: Normal breath sounds.   Musculoskeletal:     Cervical back: Normal range of motion and neck supple.  Lymphadenopathy:     Cervical: No cervical adenopathy.   Neurological:     Mental Status: She is alert and oriented to person, place, and time.     No results found for any visits on 01/24/24.    Assessment & Plan:    Problem List Items Addressed This Visit     Class 3 severe obesity due to excess calories with serious comorbidity and body mass index (BMI) of 50.0 to 59.9 in adult - Primary   Reports resolved sleep disturbance and dyspnea with daily exercise. Exercise: video aerobic exercise and pilates daily Diet: low fat, low carb, but not consistent due to work schedule and [employer] volunteering. Lost 6lbs in last 3months Wt Readings from Last 3 Encounters:  01/24/24 (!) 303 lb 3.2 oz (137.5 kg)  10/22/23 (!) 309 lb (140.2 kg)  03/08/23 297 lb (134.7 kg)    She is not interested in use of medication at this time. Encouraged to maintain a heart healthy diet, and daily exercise. F/up in 3months      Depression, recurrent (HCC)   Denies need for CBT and med at this time      Thyroid  nodule   Incidental finding on exam today, left side No dysphagia or  hoarseness or sorethroat.  Ordered thyroid  US   Relevant Orders   US  THYROID    Return in about 3 months (around 04/25/2024) for Weight management.     Roselie Mood, NP

## 2024-01-25 ENCOUNTER — Other Ambulatory Visit (HOSPITAL_BASED_OUTPATIENT_CLINIC_OR_DEPARTMENT_OTHER): Payer: Self-pay | Admitting: Nurse Practitioner

## 2024-01-25 ENCOUNTER — Telehealth (HOSPITAL_BASED_OUTPATIENT_CLINIC_OR_DEPARTMENT_OTHER): Payer: Self-pay

## 2024-01-25 DIAGNOSIS — Z1231 Encounter for screening mammogram for malignant neoplasm of breast: Secondary | ICD-10-CM

## 2024-01-27 ENCOUNTER — Ambulatory Visit (HOSPITAL_BASED_OUTPATIENT_CLINIC_OR_DEPARTMENT_OTHER)

## 2024-02-08 ENCOUNTER — Encounter (HOSPITAL_BASED_OUTPATIENT_CLINIC_OR_DEPARTMENT_OTHER): Payer: Self-pay

## 2024-02-08 ENCOUNTER — Ambulatory Visit (HOSPITAL_BASED_OUTPATIENT_CLINIC_OR_DEPARTMENT_OTHER)
Admission: RE | Admit: 2024-02-08 | Discharge: 2024-02-08 | Disposition: A | Discharge status: Home / Self Care | Source: Ambulatory Visit | Attending: Nurse Practitioner | Admitting: Nurse Practitioner

## 2024-02-08 DIAGNOSIS — Z1231 Encounter for screening mammogram for malignant neoplasm of breast: Secondary | ICD-10-CM | POA: Insufficient documentation

## 2024-02-08 DIAGNOSIS — E041 Nontoxic single thyroid nodule: Secondary | ICD-10-CM | POA: Insufficient documentation

## 2024-02-11 ENCOUNTER — Ambulatory Visit: Payer: Self-pay | Admitting: Nurse Practitioner

## 2024-02-11 DIAGNOSIS — E041 Nontoxic single thyroid nodule: Secondary | ICD-10-CM

## 2024-02-25 ENCOUNTER — Other Ambulatory Visit (HOSPITAL_COMMUNITY)
Admission: RE | Admit: 2024-02-25 | Discharge: 2024-02-25 | Disposition: A | Discharge status: Home / Self Care | Source: Ambulatory Visit | Attending: Student | Admitting: Student

## 2024-02-25 ENCOUNTER — Ambulatory Visit
Admission: RE | Admit: 2024-02-25 | Discharge: 2024-02-25 | Disposition: A | Discharge status: Home / Self Care | Source: Ambulatory Visit | Attending: Nurse Practitioner | Admitting: Nurse Practitioner

## 2024-02-25 DIAGNOSIS — E041 Nontoxic single thyroid nodule: Secondary | ICD-10-CM | POA: Diagnosis present

## 2024-02-28 LAB — CYTOLOGY - NON PAP

## 2024-03-02 ENCOUNTER — Ambulatory Visit: Payer: Self-pay | Admitting: Nurse Practitioner

## 2024-03-02 ENCOUNTER — Ambulatory Visit: Admitting: Physician Assistant

## 2024-04-21 ENCOUNTER — Encounter: Payer: Self-pay | Admitting: Physician Assistant

## 2024-04-21 ENCOUNTER — Ambulatory Visit: Admitting: Physician Assistant

## 2024-04-21 VITALS — BP 118/64 | HR 98 | Ht 64.0 in | Wt 298.4 lb

## 2024-04-21 DIAGNOSIS — Z6841 Body Mass Index (BMI) 40.0 and over, adult: Secondary | ICD-10-CM | POA: Diagnosis not present

## 2024-04-21 DIAGNOSIS — E66813 Obesity, class 3: Secondary | ICD-10-CM

## 2024-04-21 DIAGNOSIS — Z1211 Encounter for screening for malignant neoplasm of colon: Secondary | ICD-10-CM

## 2024-04-21 DIAGNOSIS — R7989 Other specified abnormal findings of blood chemistry: Secondary | ICD-10-CM

## 2024-04-21 NOTE — Patient Instructions (Signed)
 _______________________________________________________  If your blood pressure at your visit was 140/90 or greater, please contact your primary care physician to follow up on this.  _______________________________________________________  If you are age 45 or older, your body mass index should be between 23-30. Your Body mass index is 51.22 kg/m. If this is out of the aforementioned range listed, please consider follow up with your Primary Care Provider.  If you are age 93 or younger, your body mass index should be between 19-25. Your Body mass index is 51.22 kg/m. If this is out of the aformentioned range listed, please consider follow up with your Primary Care Provider.   ________________________________________________________  The [facility] providers would like to encourage you to use MYCHART to communicate with providers for non-urgent requests or questions.  Due to long hold times on the telephone, sending your provider a message by [facility] may be a faster and more efficient way to get a response.  Please allow 48 business hours for a response.  Please remember that this is for non-urgent requests.  _______________________________________________________  [facility] is using a team-based approach to care.  Your team is made up of your doctor and two to three APPS. Our APPS (Nurse Practitioners and Physician Assistants) work with your physician to ensure care continuity for you. They are fully qualified to address your health concerns and develop a treatment plan. They communicate directly with your gastroenterologist to care for you. Seeing the Advanced Practice Practitioners on your physician's team can help you by facilitating care more promptly, often allowing for earlier appointments, access to diagnostic testing, procedures, and other specialty referrals.   You have been scheduled for a colonoscopy. Please follow written instructions given to you at your visit today.    If you use inhalers (even only as needed), please bring them with you on the day of your procedure.  DO NOT TAKE 7 DAYS PRIOR TO TEST- Trulicity (dulaglutide) Ozempic, Wegovy (semaglutide) Mounjaro (tirzepatide) Bydureon Bcise (exanatide extended release)  DO NOT TAKE 1 DAY PRIOR TO YOUR TEST Rybelsus (semaglutide) Adlyxin (lixisenatide) Victoza (liraglutide) Byetta (exanatide) ___________________________________________________________________________   Due to recent changes in healthcare laws, you may see the results of your imaging and laboratory studies on MyChart before your provider has had a chance to review them.  We understand that in some cases there may be results that are confusing or concerning to you. Not all laboratory results come back in the same time frame and the provider may be waiting for multiple results in order to interpret others.  Please give us  48 hours in order for your provider to thoroughly review all the results before contacting the office for clarification of your results.   Thank you for entrusting me with your care and choosing [facility].  Alan Coombs, PA-C

## 2024-04-21 NOTE — Progress Notes (Signed)
 04/21/2024 Brittney Lester [id number] [birth year]  Referring provider: Katheen Brittney Rockford, NP Primary GI doctor: Dr. Abran (Requesting Dr. Abran)  ASSESSMENT AND PLAN:  Screening colonoscopy No family history of colon cancer, parents with colon polyps No symptoms Long discussion with the patient about cologuard versus colonoscopy without family history, no personal history of polyps and no symptoms at this time. She would still prefer a colonoscopy over other methods of screening Schedule Colonoscopy with Dr. Abran at the hospital for evaluation, We have discussed the risks of bleeding, infection, perforation, medication reactions, and remote risk of death associated with colonoscopy. All questions were answered and the patient acknowledges these risk and wishes to proceed.  Morbid obesity  Body mass index is 51.22 kg/m.  -Patient has been advised to make an attempt to improve diet and exercise patterns to aid in weight loss. 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 female with a past medical history listed below presents for evaluation of screening colonoscopy.   Patient's mom is retired Brittney Lester), patient is requesting Dr. Abran.   Discussed the use of AI scribe software for clinical note transcription with the patient, who gave verbal consent to proceed.  History of Present Illness   Brittney Lester is a 44 year old female who presents for a screening colonoscopy due to family history of polyps.  She has a family history of polyps, with her mother having polyps that could potentially become cancerous. Her father's polyps were not of the same type. Her mother, who is 25 years old and was an endoscopy nurse, has undergone colonoscopies, but the frequency is unknown to her.  She has a history of thyroid  nodules, which were biopsied and found to be benign. There is no confirmed diagnosis of Hashimoto's thyroiditis, although it has been mentioned in her medical history. She was previously diagnosed with Graves' disease in her youth and underwent a trial treatment, but has not had significant issues since then. She has been trying to lose weight without much success, prompting further investigation into her thyroid  health.  She experienced a pulmonary embolism following a COVID-19 infection. No current upper gastrointestinal symptoms such as trouble swallowing, nausea, or vomiting. No changes in bowel habits, including diarrhea or constipation, and no history of rectal bleeding.  Her social history includes rare alcohol consumption and no tobacco use. She uses ibuprofen  approximately twice a month. There is a mention  of a slightly elevated total bilirubin level, and she does not recall the specific circumstances that might have caused this elevation.     She  reports that she has never smoked. She has never used smokeless tobacco. She reports that she does not currently use alcohol after a past usage of about 1.0 standard drink of alcohol per week. She reports that she does not use drugs.  RELEVANT GI HISTORY, IMAGING AND LABS: Results    LABS Total bilirubin: Elevated  PATHOLOGY Thyroid  biopsy: Benign thyroid  nodules      CBC    Component Value Date/Time   WBC 6.6 10/29/2023 0900   RBC 4.64 10/29/2023 0900   HGB 14.1 10/29/2023 0900   HGB 13.9 06/03/2021 1052   HGB 14.5 02/21/2020 1639   HCT 41.4 10/29/2023 0900   HCT 43.9 02/21/2020 1639   PLT 182.0 10/29/2023 0900   PLT 177 06/03/2021 1052   PLT 202 02/21/2020 1639   MCV 89.3 10/29/2023 0900   MCV 93 02/21/2020 1639   MCH 30.5 06/03/2021 1052   MCHC 34.0 10/29/2023 0900   RDW 14.3 10/29/2023 0900   RDW 13.1 02/21/2020 1639   LYMPHSABS 1.6 06/03/2021 1052   LYMPHSABS 1.9 02/21/2020 1639   MONOABS 0.7 06/03/2021 1052   EOSABS 0.1 06/03/2021 1052   EOSABS 0.2 02/21/2020 1639   BASOSABS 0.0 06/03/2021 1052   BASOSABS 0.0 02/21/2020 1639   Recent Labs    10/29/23 0900  HGB 14.1    CMP     Component Value Date/Time   NA 141 10/29/2023 0900   NA 140 02/21/2020 1639   K 3.9 10/29/2023 0900   CL 104 10/29/2023 0900   CO2 28 10/29/2023 0900   GLUCOSE 91 10/29/2023 0900   BUN 20 10/29/2023 0900   BUN 11 02/21/2020 1639   CREATININE 0.90 10/29/2023 0900   CREATININE 1.00 06/03/2021 1052   CALCIUM 9.1 10/29/2023 0900   PROT 7.4 10/29/2023 0900   PROT 7.2 02/21/2020 1639   ALBUMIN 4.6 10/29/2023 0900   ALBUMIN 4.8 02/21/2020 1639   AST 18 10/29/2023 0900   AST 16 06/03/2021 1052   ALT 23 10/29/2023 0900   ALT 17 06/03/2021 1052   ALKPHOS 62 10/29/2023 0900   BILITOT 1.3 (H) 10/29/2023 0900   BILITOT 1.2 06/03/2021 1052   GFRNONAA >60 06/03/2021 1052   GFRAA >60 04/19/2020 2028      Latest Ref Rng & Units 10/29/2023    9:00 AM 09/11/2022    1:40 PM 06/03/2021   10:52 AM  Hepatic Function  Total Protein 6.0 - 8.3 g/dL 7.4  6.9  7.1   Albumin 3.5 - 5.2 g/dL 4.6  4.4  4.5   AST 0 - 37 U/L 18  16  16    ALT 0 - 35 U/L 23  24  17    Alk Phosphatase 39 - 117 U/L 62  58  57   Total Bilirubin 0.2 - 1.2 mg/dL 1.3  1.2  1.2       Current  Medications:        Current Outpatient Medications (Other):    AMBULATORY NON FORMULARY MEDICATION, Medication Name: Glowing Goddess-hormone balance daily   AMBULATORY NON FORMULARY MEDICATION, Medication Name: Brittney Lester daily   Cholecalciferol (VITAMIN D ) 125 MCG (5000 UT) CAPS, Take 1 capsule by mouth daily at 12 noon.   Omega-3 Fatty Acids (OMEGA-3 CF PO), Take by mouth.   Probiotic Product (PROBIOTIC BLEND PO), Take by mouth.   valACYclovir (  VALTREX) 500 MG tablet, Take 500 mg by mouth 2 (two) times daily.  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 Family History:  Her family history includes Colon polyps in her father and mother; Heart disease in her maternal grandfather; High Cholesterol in her father and mother; High blood pressure in her father; Hypertension in her mother and another family member; Myasthenia gravis (age of onset: 3) in her father; Obesity in her father and mother; Thyroid  disease in her mother.  REVIEW OF SYSTEMS  : All other systems reviewed and negative except where noted in the History of Present  Illness.  PHYSICAL EXAM: Ht 5' 4 (1.626 m)   Wt 298 lb 6 oz (135.3 kg)   BMI 51.22 kg/m  Physical Exam   GENERAL APPEARANCE: Well nourished, in no apparent distress. HEENT: No cervical lymphadenopathy, thyroid  nodules present, benign, sclerae anicteric, conjunctiva pink. RESPIRATORY: Respiratory effort normal, breath sounds equal bilaterally without rales, rhonchi, or wheezing. CARDIO: Regular rate and rhythm with no murmurs, rubs, or gallops, peripheral pulses intact. ABDOMEN: Soft, non-distended, active bowel sounds in all four quadrants, no tenderness to palpation, no rebound, no mass appreciated. RECTAL: Declines. MUSCULOSKELETAL: Full range of motion, normal gait, without edema. SKIN: Dry, intact without rashes or lesions. No jaundice. NEURO: Alert, oriented, no focal deficits. PSYCH: Cooperative, normal mood and affect. NECK: Thyroid  nodules present, benign.      Alan JONELLE Coombs, PA-C 3:48 PM

## 2024-04-22 NOTE — Progress Notes (Signed)
 Noted

## 2024-04-25 ENCOUNTER — Encounter: Payer: Self-pay | Admitting: Nurse Practitioner

## 2024-04-25 ENCOUNTER — Ambulatory Visit: Admitting: Nurse Practitioner

## 2024-04-25 VITALS — BP 122/76 | HR 81 | Temp 98.3°F | Ht 64.0 in | Wt 301.2 lb

## 2024-04-25 DIAGNOSIS — R17 Unspecified jaundice: Secondary | ICD-10-CM | POA: Diagnosis not present

## 2024-04-25 DIAGNOSIS — E66813 Obesity, class 3: Secondary | ICD-10-CM | POA: Diagnosis not present

## 2024-04-25 DIAGNOSIS — Z6841 Body Mass Index (BMI) 40.0 and over, adult: Secondary | ICD-10-CM

## 2024-04-25 DIAGNOSIS — Z8639 Personal history of other endocrine, nutritional and metabolic disease: Secondary | ICD-10-CM

## 2024-04-25 DIAGNOSIS — E041 Nontoxic single thyroid nodule: Secondary | ICD-10-CM

## 2024-04-25 LAB — HEPATIC FUNCTION PANEL
ALT: 23 U/L (ref 0–35)
AST: 19 U/L (ref 0–37)
Albumin: 4.5 g/dL (ref 3.5–5.2)
Alkaline Phosphatase: 56 U/L (ref 39–117)
Bilirubin, Direct: 0.2 mg/dL (ref 0.0–0.3)
Total Bilirubin: 1 mg/dL (ref 0.2–1.2)
Total Protein: 7 g/dL (ref 6.0–8.3)

## 2024-04-25 NOTE — Assessment & Plan Note (Signed)
 2lbs weight loss in last 3months. She continues to struggle with maintaining a consistent exercise regiment and making healthy choices.  She is still not interested in use of a GLP-1RA Wt Readings from Last 3 Encounters:  04/25/24 (!) 301 lb 3.2 oz (136.6 kg)  04/21/24 298 lb 6 oz (135.3 kg)  01/24/24 (!) 303 lb 3.2 oz (137.5 kg)    We discussed use of Qsymia vs Contrave. She declined. Encouraged to maintain of cardio and 2-3x/week of resistant training exercise. Maintain a mediterranean diet. F/up prn

## 2024-04-25 NOTE — Assessment & Plan Note (Addendum)
 Fhx of hypothyroidism-mother Thyroid  US  and biopsy completed 01/2024-benign Repeat thyroid  US  in 6months- 07/2024

## 2024-04-25 NOTE — Patient Instructions (Signed)
 Let me know if you decide to use contrave or Qsymia or wegovy or zepbound. You will be contacted to schedule appointment for ABDOMEN US  Go to lab

## 2024-04-25 NOTE — Assessment & Plan Note (Signed)
 Asymptomatic Repeat thyroid  panel, thyroid  immunoglobulin and thyroid  antibody Repeat thyroid  US  in 42months-07/2024

## 2024-04-25 NOTE — Progress Notes (Signed)
 Established Patient Visit  Patient: Brittney Lester   DOB: March 07, 1981   43 y.o. Female  MRN: [id number] Visit Date: 04/25/2024  Subjective:    Chief Complaint  Patient presents with   Follow-up    3 month follow up for weight management    HPI Thyroid  nodule Fhx of hypothyroidism-mother Thyroid  US  and biopsy completed 01/2024-benign Repeat thyroid  US  in 6months- 07/2024  History of Graves' disease Asymptomatic Repeat thyroid  panel, thyroid  immunoglobulin and thyroid  antibody Repeat thyroid  US  in 2months-07/2024  Class 3 severe obesity due to excess calories with serious comorbidity and body mass index (BMI) of 50.0 to 59.9 in adult (HCC) 2lbs weight loss in last 3months. She continues to struggle with maintaining a consistent exercise regiment and making healthy choices.  She is still not interested in use of a GLP-1RA Wt Readings from Last 3 Encounters:  04/25/24 (!) 301 lb 3.2 oz (136.6 kg)  04/21/24 298 lb 6 oz (135.3 kg)  01/24/24 (!) 303 lb 3.2 oz (137.5 kg)    We discussed use of Qsymia vs Contrave. She declined. Encouraged to maintain of cardio and 2-3x/week of resistant training exercise. Maintain a mediterranean diet. F/up prn  Wt Readings from Last 3 Encounters:  04/25/24 (!) 301 lb 3.2 oz (136.6 kg)  04/21/24 298 lb 6 oz (135.3 kg)  01/24/24 (!) 303 lb 3.2 oz (137.5 kg)    Reviewed medical, surgical, and social history today  Medications: Outpatient Medications Prior to Visit  Medication Sig   AMBULATORY NON FORMULARY MEDICATION Medication Name: Glowing Goddess-hormone balance daily   AMBULATORY NON FORMULARY MEDICATION Medication Name: Rosabella Moringa daily   Cholecalciferol (VITAMIN D ) 125 MCG (5000 UT) CAPS Take 1 capsule by mouth daily at 12 noon.   Omega-3 Fatty Acids (OMEGA-3 CF PO) Take by mouth.   Probiotic Product (PROBIOTIC BLEND PO) Take by mouth.   valACYclovir (VALTREX) 500 MG tablet Take 500 mg by mouth 2 (two) times  daily.   No facility-administered medications prior to visit.   Reviewed past medical and social history.   ROS per HPI above  Last CBC Lab Results  Component Value Date   WBC 6.6 10/29/2023   HGB 14.1 10/29/2023   HCT 41.4 10/29/2023   MCV 89.3 10/29/2023   MCH 30.5 06/03/2021   RDW 14.3 10/29/2023   PLT 182.0 10/29/2023   Last metabolic panel Lab Results  Component Value Date   GLUCOSE 91 10/29/2023   NA 141 10/29/2023   K 3.9 10/29/2023   CL 104 10/29/2023   CO2 28 10/29/2023   BUN 20 10/29/2023   CREATININE 0.90 10/29/2023   GFR 77.45 10/29/2023   CALCIUM 9.1 10/29/2023   PHOS 3.7 04/16/2020   PROT 7.4 10/29/2023   ALBUMIN 4.6 10/29/2023   LABGLOB 2.4 02/21/2020   AGRATIO 2.0 02/21/2020   BILITOT 1.3 (H) 10/29/2023   ALKPHOS 62 10/29/2023   AST 18 10/29/2023   ALT 23 10/29/2023   ANIONGAP 8 06/03/2021   Last lipids Lab Results  Component Value Date   CHOL 150 10/29/2023   HDL 45.30 10/29/2023   LDLCALC 85 10/29/2023   TRIG 97.0 10/29/2023   CHOLHDL 3 10/29/2023   Last hemoglobin A1c Lab Results  Component Value Date   HGBA1C 4.9 10/29/2023   Last thyroid  functions Lab Results  Component Value Date   TSH 0.88 10/29/2023   T3TOTAL 125 02/21/2020   T4TOTAL 8.8  01/17/2021        Objective:  BP 122/76 (BP Location: Left Arm, Patient Position: Sitting, Cuff Size: Large)   Pulse 81   Temp 98.3 F (36.8 C) (Oral)   Ht 5' 4 (1.626 m)   Wt (!) 301 lb 3.2 oz (136.6 kg)   SpO2 97%   BMI 51.70 kg/m      Physical Exam Vitals and nursing note reviewed.  Constitutional:      Appearance: She is obese.  Cardiovascular:     Rate and Rhythm: Normal rate.     Pulses: Normal pulses.  Pulmonary:     Effort: Pulmonary effort is normal.  Neurological:     Mental Status: She is alert and oriented to person, place, and time.     No results found for any visits on 04/25/24.    Assessment & Plan:    Problem List Items Addressed This Visit      Class 3 severe obesity due to excess calories with serious comorbidity and body mass index (BMI) of 50.0 to 59.9 in adult - Primary   2lbs weight loss in last 3months. She continues to struggle with maintaining a consistent exercise regiment and making healthy choices.  She is still not interested in use of a GLP-1RA Wt Readings from Last 3 Encounters:  04/25/24 (!) 301 lb 3.2 oz (136.6 kg)  04/21/24 298 lb 6 oz (135.3 kg)  01/24/24 (!) 303 lb 3.2 oz (137.5 kg)    We discussed use of Qsymia vs Contrave. She declined. Encouraged to maintain of cardio and 2-3x/week of resistant training exercise. Maintain a mediterranean diet. F/up prn      History of Graves' disease   Asymptomatic Repeat thyroid  panel, thyroid  immunoglobulin and thyroid  antibody Repeat thyroid  US  in 27months-07/2024      Relevant Orders   Thyroid  Panel With TSH   Thyroid  peroxidase antibody   Thyroid  stimulating immunoglobulin   Thyroid  nodule   Fhx of hypothyroidism-mother Thyroid  US  and biopsy completed 01/2024-benign Repeat thyroid  US  in 6months- 07/2024      Relevant Orders   Thyroid  Panel With TSH   Thyroid  peroxidase antibody   Thyroid  stimulating immunoglobulin   Other Visit Diagnoses       Elevated bilirubin       Relevant Orders   Hepatic function panel   US  Abdomen Limited RUQ (LIVER/GB)   Bilirubin, fractionated(tot/dir/indir)      Return if symptoms worsen or fail to improve.     Roselie Mood, NP

## 2024-04-26 ENCOUNTER — Ambulatory Visit: Payer: Self-pay | Admitting: Nurse Practitioner

## 2024-04-28 LAB — THYROID PEROXIDASE ANTIBODY: Thyroperoxidase Ab SerPl-aCnc: 231 [IU]/mL — ABNORMAL HIGH (ref <9)

## 2024-04-28 LAB — THYROID PANEL WITH TSH
Free Thyroxine Index: 2.9 (ref 1.4–3.8)
T3 Uptake: 31 % (ref 22–35)
T4, Total: 9.3 ug/dL (ref 5.1–11.9)
TSH: 0.86 m[IU]/L

## 2024-04-28 LAB — BILIRUBIN, FRACTIONATED(TOT/DIR/INDIR)
Bilirubin, Direct: 0.2 mg/dL (ref 0.0–0.2)
Indirect Bilirubin: 0.8 mg/dL (ref 0.2–1.2)
Total Bilirubin: 1 mg/dL (ref 0.2–1.2)

## 2024-04-28 LAB — THYROID STIMULATING IMMUNOGLOBULIN: TSI: 93 %{baseline} (ref <140)

## 2024-05-05 ENCOUNTER — Ambulatory Visit
Admission: RE | Admit: 2024-05-05 | Discharge: 2024-05-05 | Disposition: A | Discharge status: Home / Self Care | Source: Ambulatory Visit | Attending: Nurse Practitioner | Admitting: Nurse Practitioner

## 2024-05-05 DIAGNOSIS — R17 Unspecified jaundice: Secondary | ICD-10-CM

## 2024-05-26 ENCOUNTER — Encounter: Payer: Self-pay | Admitting: Internal Medicine

## 2024-05-26 ENCOUNTER — Telehealth: Payer: Self-pay | Admitting: Physician Assistant

## 2024-05-26 MED ORDER — NA SULFATE-K SULFATE-MG SULF 17.5-3.13-1.6 GM/177ML PO SOLN: 1.0000 | Freq: Once | ORAL | 0 refills | Status: AC

## 2024-05-26 NOTE — Telephone Encounter (Signed)
 Done

## 2024-05-26 NOTE — Telephone Encounter (Signed)
 Inbound call from patient stating that her pharmacy does not have her Suprep prescription. Requesting medication be sent to [facility] on [address] in [place]. Please advise.

## 2024-05-31 ENCOUNTER — Encounter (HOSPITAL_COMMUNITY): Payer: Self-pay | Admitting: Internal Medicine

## 2024-06-02 ENCOUNTER — Telehealth: Payer: Self-pay

## 2024-06-02 NOTE — Telephone Encounter (Signed)
 Procedure:COLON Procedure date: 06/07/24 Procedure location: [facility] Arrival Time: 8:21 Spoke with the patient Y/N: Y Any prep concerns? N  Has the patient obtained the prep from the pharmacy ? Y Do you have a care partner and transportation: Y Any additional concerns? N

## 2024-06-07 ENCOUNTER — Other Ambulatory Visit: Payer: Self-pay

## 2024-06-07 ENCOUNTER — Ambulatory Visit (HOSPITAL_COMMUNITY)
Admission: RE | Admit: 2024-06-07 | Discharge: 2024-06-07 | Disposition: A | Discharge status: Home / Self Care | Attending: Internal Medicine | Admitting: Internal Medicine

## 2024-06-07 ENCOUNTER — Ambulatory Visit (HOSPITAL_COMMUNITY): Admitting: Certified Registered Nurse Anesthetist

## 2024-06-07 ENCOUNTER — Encounter (HOSPITAL_COMMUNITY)
Admission: RE | Disposition: A | Discharge status: Home / Self Care | Payer: Self-pay | Source: Home / Self Care | Attending: Internal Medicine

## 2024-06-07 ENCOUNTER — Encounter (HOSPITAL_COMMUNITY): Payer: Self-pay | Admitting: Internal Medicine

## 2024-06-07 DIAGNOSIS — Z1211 Encounter for screening for malignant neoplasm of colon: Secondary | ICD-10-CM

## 2024-06-07 DIAGNOSIS — F418 Other specified anxiety disorders: Secondary | ICD-10-CM

## 2024-06-07 DIAGNOSIS — Z8616 Personal history of COVID-19: Secondary | ICD-10-CM | POA: Insufficient documentation

## 2024-06-07 DIAGNOSIS — E6689 Other obesity not elsewhere classified: Secondary | ICD-10-CM | POA: Diagnosis not present

## 2024-06-07 DIAGNOSIS — Z86711 Personal history of pulmonary embolism: Secondary | ICD-10-CM | POA: Diagnosis not present

## 2024-06-07 DIAGNOSIS — Z83719 Family history of colon polyps, unspecified: Secondary | ICD-10-CM | POA: Diagnosis present

## 2024-06-07 DIAGNOSIS — E0500 Thyrotoxicosis with diffuse goiter without thyrotoxic crisis or storm: Secondary | ICD-10-CM | POA: Insufficient documentation

## 2024-06-07 DIAGNOSIS — Z6841 Body Mass Index (BMI) 40.0 and over, adult: Secondary | ICD-10-CM | POA: Diagnosis not present

## 2024-06-07 HISTORY — PX: COLONOSCOPY: SHX5424

## 2024-06-07 SURGERY — COLONOSCOPY
Anesthesia: Monitor Anesthesia Care

## 2024-06-07 MED ORDER — ONDANSETRON HCL 4 MG/2ML IJ SOLN
INTRAMUSCULAR | Status: DC | PRN
  Administered 2024-06-07: 4 mg via INTRAVENOUS

## 2024-06-07 MED ORDER — SODIUM CHLORIDE 0.9 % IV SOLN
INTRAVENOUS | Status: DC | PRN
Start: 2024-06-07 — End: 2024-06-07

## 2024-06-07 MED ORDER — PROPOFOL 500 MG/50ML IV EMUL
INTRAVENOUS | Status: DC | PRN
  Administered 2024-06-07: 150 ug/kg/min via INTRAVENOUS

## 2024-06-07 MED ORDER — LACTATED RINGERS IV SOLN: INTRAVENOUS | Status: DC | PRN

## 2024-06-07 MED ORDER — PROPOFOL 10 MG/ML IV BOLUS
INTRAVENOUS | Status: DC | PRN
  Administered 2024-06-07: 20 mg via INTRAVENOUS

## 2024-06-07 NOTE — Discharge Instructions (Signed)

## 2024-06-07 NOTE — Transfer of Care (Signed)
 Immediate Anesthesia Transfer of Care Note  Patient: Brittney Lester  Procedure(s) Performed: COLONOSCOPY  Patient Location: Endoscopy Unit  Anesthesia Type:MAC  Level of Consciousness: awake, alert , and oriented  Airway & Oxygen Therapy: Patient Spontanous Breathing  Post-op Assessment: Report given to RN and Post -op Vital signs reviewed and stable  Post vital signs: Reviewed and stable  Last Vitals:  Vitals Value Taken Time  BP    Temp    Pulse 80 06/07/24 10:47  Resp 22 06/07/24 10:47  SpO2 94 % 06/07/24 10:47  Vitals shown include unfiled device data.  Last Pain:  Vitals:   06/07/24 0843  TempSrc: Temporal  PainSc: 0-No pain         Complications: No notable events documented.

## 2024-06-07 NOTE — Anesthesia Procedure Notes (Signed)
 Procedure Name: MAC Date/Time: 06/07/2024 10:14 AM  Performed by: Buster Catheryn SAUNDERS, CRNAPre-anesthesia Checklist: Patient identified, Emergency Drugs available, Suction available, Patient being monitored and Timeout performed Patient Re-evaluated:Patient Re-evaluated prior to induction Oxygen Delivery Method: Supernova nasal CPAP Placement Confirmation: positive ETCO2

## 2024-06-07 NOTE — Anesthesia Preprocedure Evaluation (Signed)
 Anesthesia Evaluation  Patient identified by MRN, date of birth, ID band Patient awake    Reviewed: Allergy & Precautions, H&P , NPO status , Patient's Chart, lab work & pertinent test results  History of Anesthesia Complications Negative for: history of anesthetic complications  Airway Mallampati: II  TM Distance: >3 FB Neck ROM: Full    Dental no notable dental hx.    Pulmonary  Hx of PE   Pulmonary exam normal breath sounds clear to auscultation       Cardiovascular (-) angina negative cardio ROS Normal cardiovascular exam Rhythm:Regular Rate:Normal     Neuro/Psych neg Seizures PSYCHIATRIC DISORDERS Anxiety Depression    negative neurological ROS     GI/Hepatic negative GI ROS, Neg liver ROS,,,  Endo/Other    Class 4 obesityGraves disease   Renal/GU negative Renal ROS  negative genitourinary   Musculoskeletal negative musculoskeletal ROS (+)    Abdominal  (+) + obese  Peds negative pediatric ROS (+)  Hematology negative hematology ROS (+)   Anesthesia Other Findings   Reproductive/Obstetrics negative OB ROS                              Anesthesia Physical Anesthesia Plan  ASA: 2  Anesthesia Plan: MAC   Post-op Pain Management:    Induction: Intravenous  PONV Risk Score and Plan: 2 and Propofol  infusion and Treatment may vary due to age or medical condition  Airway Management Planned: Natural Airway  Additional Equipment:   Intra-op Plan:   Post-operative Plan: Extubation in OR  Informed Consent: I have reviewed the patients History and Physical, chart, labs and discussed the procedure including the risks, benefits and alternatives for the proposed anesthesia with the patient or authorized representative who has indicated his/her understanding and acceptance.     Dental advisory given  Plan Discussed with: CRNA  Anesthesia Plan Comments:          Anesthesia Quick Evaluation

## 2024-06-07 NOTE — Op Note (Signed)
 [facility] Patient Name: Brittney Lester Procedure Date: 06/07/2024 MRN: [id number] Attending MD: Norleen SAILOR. Abran , MD, [id number] Date of Birth: Oct 31, 1980 CSN: [id number] Age: 43 Admit Type: Outpatient Procedure:                Colonoscopy Indications:              Screening for colorectal malignant neoplasm Providers:                Norleen SAILOR. Abran, MD, Ozell Pouch, Felice Sar,                            Technician Referring MD:             Roselie Mood, NP Medicines:                Monitored Anesthesia Care Complications:            No immediate complications. Estimated blood loss:                            None. Estimated Blood Loss:     Estimated blood loss: none. Procedure:                Pre-Anesthesia Assessment:                           - Prior to the procedure, a History and Physical                            was performed, and patient medications and                            allergies were reviewed. The patient's tolerance of                            previous anesthesia was also reviewed. The risks                            and benefits of the procedure and the sedation                            options and risks were discussed with the patient.                            All questions were answered, and informed consent                            was obtained. Prior Anticoagulants: The patient has                            taken no anticoagulant or antiplatelet agents. ASA                            Grade Assessment: III - A patient with severe  systemic disease. After reviewing the risks and                            benefits, the patient was deemed in satisfactory                            condition to undergo the procedure.                           After obtaining informed consent, the colonoscope                            was passed under direct vision. Throughout the                            procedure, the  patient's blood pressure, pulse, and                            oxygen saturations were monitored continuously. The                            CF-HQ190L ([id number]) Olympus colonoscope was                            introduced through the anus and advanced to the the                            cecum, identified by appendiceal orifice and                            ileocecal valve. The ileocecal valve, appendiceal                            orifice, and rectum were photographed. The quality                            of the bowel preparation was excellent. The                            colonoscopy was performed without difficulty. The                            patient tolerated the procedure well. The bowel                            preparation used was SUPREP via split dose                            instruction. Scope In: 10:28:19 AM Scope Out: 10:39:16 AM Scope Withdrawal Time: 0 hours 8 minutes 47 seconds  Total Procedure Duration: 0 hours 10 minutes 57 seconds  Findings:      The entire examined colon appeared normal on direct and retroflexion       views. Impression:               -  The entire examined colon is normal on direct and                            retroflexion views.                           - No specimens collected. Moderate Sedation:      none Recommendation:           - Repeat colonoscopy in 10 years for screening                            purposes.                           - Patient has a contact number available for                            emergencies. The signs and symptoms of potential                            delayed complications were discussed with the                            patient. Return to normal activities tomorrow.                            Written discharge instructions were provided to the                            patient.                           - Resume previous diet.                           - Continue present  medications. Procedure Code(s):        --- Professional ---                           H9878, Colorectal cancer screening; colonoscopy on                            individual not meeting criteria for high risk Diagnosis Code(s):        --- Professional ---                           Z12.11, Encounter for screening for malignant                            neoplasm of colon CPT copyright 2022 [employer]. All rights reserved. The codes documented in this report are preliminary and upon coder review may  be revised to meet current compliance requirements. Norleen SAILOR. Abran, MD 06/07/2024 10:49:23 AM This report has been signed electronically. Number of Addenda: 0

## 2024-06-07 NOTE — H&P (Signed)
 Expand All Collapse All        04/21/2024 Brittney Lester [id number] 06-Nov-1980   Referring provider: Katheen Roselie Rockford, NP Primary GI doctor: Dr. Abran (Requesting Dr. Abran)   ASSESSMENT AND PLAN:  Screening colonoscopy No family history of colon cancer, parents with colon polyps No symptoms Long discussion with the patient about cologuard versus colonoscopy without family history, no personal history of polyps and no symptoms at this time. She would still prefer a colonoscopy over other methods of screening Schedule Colonoscopy with Dr. Abran at the hospital for evaluation, We have discussed the risks of bleeding, infection, perforation, medication reactions, and remote risk of death associated with colonoscopy. All questions were answered and the patient acknowledges these risk and wishes to proceed.   Morbid obesity  Body mass index is 51.22 kg/m.  -Patient has been advised to make an attempt to improve diet and exercise patterns to aid in weight loss. 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 female with a past medical history listed below presents for  evaluation of screening colonoscopy.  Patient's mom is retired Glass Blower/designer [name]), patient is requesting Dr. Abran.    Discussed the use of AI scribe software for clinical note transcription with the patient, who gave verbal consent to proceed.   History of Present Illness   Brittney Lester is a 43 year old female who presents for a screening colonoscopy due to family history of polyps.   She has a family history of polyps, with her mother having polyps that could potentially become cancerous. Her father's polyps were not of the same type. Her mother, who is 82 years old and was an endoscopy nurse, has undergone colonoscopies, but the frequency is unknown to her.   She has a history of thyroid  nodules, which were biopsied and found to be benign. There is no confirmed diagnosis of Hashimoto's thyroiditis, although it has been mentioned in her medical history. She was previously diagnosed with Graves' disease in her youth and underwent a trial treatment, but has not had significant issues since then. She has been trying to lose weight without much success, prompting further investigation into her thyroid  health.   She experienced a pulmonary embolism following a COVID-19 infection. No current upper gastrointestinal symptoms such as trouble swallowing, nausea, or vomiting. No changes in bowel habits, including diarrhea or constipation, and no history of rectal bleeding.  Her social history includes rare alcohol consumption and no tobacco use. She uses ibuprofen  approximately twice a month. There is a mention of a slightly elevated total bilirubin level, and she does not recall the specific circumstances that might have caused this elevation.    She  reports that she has never smoked. She has never used smokeless tobacco. She reports that she does not currently use alcohol after a past usage of about 1.0 standard drink of alcohol per week. She reports that she does not use drugs.   RELEVANT GI  HISTORY, IMAGING AND LABS: Results   LABS Total bilirubin: Elevated   PATHOLOGY Thyroid  biopsy: Benign thyroid  nodules       CBC Labs (Brief)          Component Value Date/Time    WBC 6.6 10/29/2023 0900    RBC 4.64 10/29/2023 0900    HGB 14.1 10/29/2023 0900    HGB 13.9 06/03/2021 1052    HGB 14.5 02/21/2020 1639    HCT 41.4 10/29/2023 0900    HCT 43.9 02/21/2020 1639    PLT 182.0 10/29/2023 0900    PLT 177 06/03/2021 1052    PLT 202 02/21/2020 1639    MCV 89.3 10/29/2023 0900    MCV 93 02/21/2020 1639    MCH 30.5 06/03/2021 1052    MCHC 34.0 10/29/2023 0900    RDW 14.3 10/29/2023 0900    RDW 13.1 02/21/2020 1639    LYMPHSABS 1.6 06/03/2021 1052    LYMPHSABS 1.9 02/21/2020 1639    MONOABS 0.7 06/03/2021 1052    EOSABS 0.1 06/03/2021 1052    EOSABS 0.2 02/21/2020 1639    BASOSABS 0.0 06/03/2021 1052    BASOSABS 0.0 02/21/2020 1639      Recent Labs (within last 365 days)     Recent Labs    10/29/23 0900  HGB 14.1        CMP     Labs (Brief)          Component Value Date/Time    NA 141 10/29/2023 0900    NA 140 02/21/2020 1639    K 3.9 10/29/2023 0900    CL 104 10/29/2023 0900    CO2 28 10/29/2023 0900    GLUCOSE 91 10/29/2023 0900    BUN 20 10/29/2023 0900    BUN 11 02/21/2020 1639    CREATININE 0.90 10/29/2023 0900    CREATININE 1.00 06/03/2021 1052    CALCIUM 9.1 10/29/2023 0900    PROT 7.4 10/29/2023 0900    PROT 7.2 02/21/2020 1639    ALBUMIN 4.6 10/29/2023 0900    ALBUMIN 4.8 02/21/2020 1639    AST 18 10/29/2023 0900    AST 16 06/03/2021 1052    ALT 23 10/29/2023 0900    ALT 17 06/03/2021 1052    ALKPHOS 62 10/29/2023 0900    BILITOT 1.3 (H) 10/29/2023 0900    BILITOT 1.2 06/03/2021 1052    GFRNONAA >60 06/03/2021 1052    GFRAA >60 04/19/2020 2028          Latest Ref Rng & Units 10/29/2023    9:00 AM 09/11/2022    1:40 PM 06/03/2021   10:52 AM  Hepatic Function  Total Protein 6.0 - 8.3 g/dL 7.4  6.9  7.1   Albumin 3.5 - 5.2 g/dL  4.6  4.4  4.5   AST 0 - 37 U/L 18  16  16    ALT 0 - 35 U/L 23  24  17  Alk Phosphatase 39 - 117 U/L 62  58  57   Total Bilirubin 0.2 - 1.2 mg/dL 1.3  1.2  1.2       Current Medications:              Current Outpatient Medications (Other):    AMBULATORY NON FORMULARY MEDICATION, Medication Name: Glowing Goddess-hormone balance daily   AMBULATORY NON FORMULARY MEDICATION, Medication Name: Rosabella Moringa daily   Cholecalciferol (VITAMIN D ) 125 MCG (5000 UT) CAPS, Take 1 capsule by mouth daily at 12 noon.   Omega-3 Fatty Acids (OMEGA-3 CF PO), Take by mouth.   Probiotic Product (PROBIOTIC BLEND PO), Take by mouth.   valACYclovir (VALTREX) 500 MG tablet, Take 500 mg by mouth 2 (two) times daily.   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 Family History:  Her family history includes Colon polyps in her father and mother; Heart disease in her maternal grandfather; High Cholesterol in her father and mother; High blood pressure in her father; Hypertension in her mother and another family member;  Myasthenia gravis (age of onset: 26) in her father; Obesity in her father and mother; Thyroid  disease in her mother.   REVIEW OF SYSTEMS  : All other systems reviewed and negative except where noted in the History of Present Illness.   PHYSICAL EXAM: Ht 5' 4 (1.626 m)   Wt 298 lb 6 oz (135.3 kg)   BMI 51.22 kg/m  Physical Exam   GENERAL APPEARANCE: Well nourished, in no apparent distress. HEENT: No cervical lymphadenopathy, thyroid  nodules present, benign, sclerae anicteric, conjunctiva pink. RESPIRATORY: Respiratory effort normal, breath sounds equal bilaterally without rales, rhonchi, or wheezing. CARDIO: Regular rate and rhythm with no murmurs, rubs, or gallops, peripheral pulses intact. ABDOMEN: Soft, non-distended, active bowel sounds in all four quadrants, no tenderness to palpation, no rebound, no mass appreciated. RECTAL: Declines. MUSCULOSKELETAL: Full range of motion, normal gait, without edema. SKIN: Dry, intact without rashes or lesions. No jaundice. NEURO: Alert, oriented, no focal deficits. PSYCH: Cooperative, normal mood and affect. NECK: Thyroid  nodules present, benign.       Alan JONELLE Coombs, PA-C 3:48 PM          Recent complete evaluation including history and physical exam as noted above.  No interval change.  She is now for index screening colonoscopy.

## 2024-06-08 NOTE — Anesthesia Postprocedure Evaluation (Signed)
 Anesthesia Post Note  Patient: Brittney Lester  Procedure(s) Performed: COLONOSCOPY     Patient location during evaluation: PACU Anesthesia Type: MAC Level of consciousness: awake and alert Pain management: pain level controlled Vital Signs Assessment: post-procedure vital signs reviewed and stable Respiratory status: spontaneous breathing, nonlabored ventilation, respiratory function stable and patient connected to nasal cannula oxygen Cardiovascular status: stable and blood pressure returned to baseline Postop Assessment: no apparent nausea or vomiting Anesthetic complications: no   No notable events documented.  Last Vitals:  Vitals:   06/07/24 1050 06/07/24 1104  BP: (!) 111/47 (!) 133/59  Pulse: 78 77  Resp: 11 18  Temp:    SpO2: 96% 97%    Last Pain:  Vitals:   06/07/24 1104  TempSrc:   PainSc: 0-No pain                 Thom JONELLE Peoples

## 2024-06-09 ENCOUNTER — Encounter (HOSPITAL_COMMUNITY): Payer: Self-pay | Admitting: Internal Medicine

## 2024-07-27 ENCOUNTER — Encounter: Payer: Self-pay | Admitting: Nurse Practitioner
